# Patient Record
Sex: Male | Born: 1935 | Race: White | Hispanic: No | Marital: Married | Smoking: Former smoker
Health system: Southern US, Community
[De-identification: ages and names within clinical notes are randomized; demographics above are authoritative.]

## PROBLEM LIST (undated history)

## (undated) DIAGNOSIS — F0390 Unspecified dementia without behavioral disturbance: Secondary | ICD-10-CM

## (undated) DIAGNOSIS — F0280 Dementia in other diseases classified elsewhere without behavioral disturbance: Secondary | ICD-10-CM

## (undated) DIAGNOSIS — J189 Pneumonia, unspecified organism: Secondary | ICD-10-CM

## (undated) DIAGNOSIS — E079 Disorder of thyroid, unspecified: Secondary | ICD-10-CM

## (undated) DIAGNOSIS — E785 Hyperlipidemia, unspecified: Secondary | ICD-10-CM

## (undated) DIAGNOSIS — I10 Essential (primary) hypertension: Secondary | ICD-10-CM

## (undated) DIAGNOSIS — G309 Alzheimer's disease, unspecified: Secondary | ICD-10-CM

## (undated) HISTORY — PX: LUMBAR LAMINECTOMY: SHX95

## (undated) HISTORY — DX: Hyperlipidemia, unspecified: E78.5

## (undated) HISTORY — DX: Essential (primary) hypertension: I10

## (undated) HISTORY — DX: Unspecified dementia, unspecified severity, without behavioral disturbance, psychotic disturbance, mood disturbance, and anxiety: F03.90

## (undated) HISTORY — PX: HIP SURGERY: SHX245

---

## 1998-01-06 ENCOUNTER — Ambulatory Visit (HOSPITAL_COMMUNITY): Admission: RE | Admit: 1998-01-06 | Discharge: 1998-01-06 | Payer: Self-pay | Admitting: Orthopaedic Surgery

## 2001-03-29 ENCOUNTER — Ambulatory Visit (HOSPITAL_COMMUNITY): Admission: RE | Admit: 2001-03-29 | Discharge: 2001-03-29 | Payer: Self-pay | Admitting: Endocrinology

## 2001-03-29 ENCOUNTER — Encounter: Payer: Self-pay | Admitting: Endocrinology

## 2001-04-15 ENCOUNTER — Encounter: Payer: Self-pay | Admitting: Neurosurgery

## 2001-04-16 ENCOUNTER — Encounter: Payer: Self-pay | Admitting: Neurosurgery

## 2001-04-16 ENCOUNTER — Inpatient Hospital Stay (HOSPITAL_COMMUNITY): Admission: RE | Admit: 2001-04-16 | Discharge: 2001-04-18 | Payer: Self-pay | Admitting: Neurosurgery

## 2001-12-17 ENCOUNTER — Inpatient Hospital Stay (HOSPITAL_COMMUNITY): Admission: EM | Admit: 2001-12-17 | Discharge: 2001-12-21 | Payer: Self-pay | Admitting: Emergency Medicine

## 2001-12-17 ENCOUNTER — Encounter: Payer: Self-pay | Admitting: Emergency Medicine

## 2001-12-18 ENCOUNTER — Encounter: Payer: Self-pay | Admitting: Endocrinology

## 2001-12-19 ENCOUNTER — Encounter: Payer: Self-pay | Admitting: Internal Medicine

## 2001-12-20 ENCOUNTER — Encounter: Payer: Self-pay | Admitting: Internal Medicine

## 2002-01-11 ENCOUNTER — Other Ambulatory Visit: Admission: RE | Admit: 2002-01-11 | Discharge: 2002-01-11 | Payer: Self-pay | Admitting: Internal Medicine

## 2002-02-02 ENCOUNTER — Encounter: Admission: RE | Admit: 2002-02-02 | Discharge: 2002-05-03 | Payer: Self-pay | Admitting: Endocrinology

## 2002-05-04 ENCOUNTER — Encounter: Admission: RE | Admit: 2002-05-04 | Discharge: 2002-05-06 | Payer: Self-pay | Admitting: Endocrinology

## 2002-07-22 ENCOUNTER — Encounter: Payer: Self-pay | Admitting: Orthopedic Surgery

## 2002-07-29 ENCOUNTER — Encounter: Payer: Self-pay | Admitting: Orthopedic Surgery

## 2002-07-29 ENCOUNTER — Inpatient Hospital Stay (HOSPITAL_COMMUNITY): Admission: RE | Admit: 2002-07-29 | Discharge: 2002-08-02 | Payer: Self-pay | Admitting: Orthopedic Surgery

## 2002-08-02 ENCOUNTER — Inpatient Hospital Stay (HOSPITAL_COMMUNITY)
Admission: RE | Admit: 2002-08-02 | Discharge: 2002-08-09 | Payer: Self-pay | Admitting: Physical Medicine & Rehabilitation

## 2005-01-08 ENCOUNTER — Emergency Department (HOSPITAL_COMMUNITY): Admission: EM | Admit: 2005-01-08 | Discharge: 2005-01-08 | Payer: Self-pay | Admitting: Emergency Medicine

## 2005-01-15 ENCOUNTER — Ambulatory Visit (HOSPITAL_COMMUNITY): Admission: RE | Admit: 2005-01-15 | Discharge: 2005-01-15 | Payer: Self-pay | Admitting: Endocrinology

## 2012-05-07 ENCOUNTER — Encounter (HOSPITAL_COMMUNITY): Payer: Self-pay | Admitting: *Deleted

## 2012-05-07 ENCOUNTER — Emergency Department (HOSPITAL_COMMUNITY): Payer: Medicare Other

## 2012-05-07 ENCOUNTER — Inpatient Hospital Stay (HOSPITAL_COMMUNITY)
Admission: EM | Admit: 2012-05-07 | Discharge: 2012-05-11 | DRG: 872 | Disposition: A | Discharge status: Skilled Nursing Facility | Payer: Medicare Other | Attending: Endocrinology | Admitting: Endocrinology

## 2012-05-07 DIAGNOSIS — Z87891 Personal history of nicotine dependence: Secondary | ICD-10-CM

## 2012-05-07 DIAGNOSIS — K089 Disorder of teeth and supporting structures, unspecified: Secondary | ICD-10-CM | POA: Diagnosis present

## 2012-05-07 DIAGNOSIS — Z882 Allergy status to sulfonamides status: Secondary | ICD-10-CM

## 2012-05-07 DIAGNOSIS — I10 Essential (primary) hypertension: Secondary | ICD-10-CM | POA: Diagnosis present

## 2012-05-07 DIAGNOSIS — D649 Anemia, unspecified: Secondary | ICD-10-CM | POA: Diagnosis present

## 2012-05-07 DIAGNOSIS — F329 Major depressive disorder, single episode, unspecified: Secondary | ICD-10-CM | POA: Diagnosis present

## 2012-05-07 DIAGNOSIS — M216X9 Other acquired deformities of unspecified foot: Secondary | ICD-10-CM | POA: Diagnosis present

## 2012-05-07 DIAGNOSIS — E785 Hyperlipidemia, unspecified: Secondary | ICD-10-CM | POA: Diagnosis present

## 2012-05-07 DIAGNOSIS — A419 Sepsis, unspecified organism: Secondary | ICD-10-CM | POA: Diagnosis present

## 2012-05-07 DIAGNOSIS — E860 Dehydration: Secondary | ICD-10-CM | POA: Diagnosis present

## 2012-05-07 DIAGNOSIS — N390 Urinary tract infection, site not specified: Secondary | ICD-10-CM | POA: Diagnosis present

## 2012-05-07 DIAGNOSIS — R627 Adult failure to thrive: Secondary | ICD-10-CM | POA: Diagnosis present

## 2012-05-07 DIAGNOSIS — J449 Chronic obstructive pulmonary disease, unspecified: Secondary | ICD-10-CM | POA: Diagnosis present

## 2012-05-07 DIAGNOSIS — F0280 Dementia in other diseases classified elsewhere without behavioral disturbance: Secondary | ICD-10-CM | POA: Diagnosis present

## 2012-05-07 DIAGNOSIS — Z96649 Presence of unspecified artificial hip joint: Secondary | ICD-10-CM

## 2012-05-07 DIAGNOSIS — E871 Hypo-osmolality and hyponatremia: Secondary | ICD-10-CM | POA: Diagnosis present

## 2012-05-07 DIAGNOSIS — F3289 Other specified depressive episodes: Secondary | ICD-10-CM | POA: Diagnosis present

## 2012-05-07 DIAGNOSIS — D72829 Elevated white blood cell count, unspecified: Secondary | ICD-10-CM | POA: Diagnosis present

## 2012-05-07 DIAGNOSIS — G309 Alzheimer's disease, unspecified: Secondary | ICD-10-CM | POA: Diagnosis present

## 2012-05-07 DIAGNOSIS — A4151 Sepsis due to Escherichia coli [E. coli]: Principal | ICD-10-CM | POA: Diagnosis present

## 2012-05-07 DIAGNOSIS — Z792 Long term (current) use of antibiotics: Secondary | ICD-10-CM

## 2012-05-07 DIAGNOSIS — J189 Pneumonia, unspecified organism: Secondary | ICD-10-CM | POA: Diagnosis present

## 2012-05-07 DIAGNOSIS — M1990 Unspecified osteoarthritis, unspecified site: Secondary | ICD-10-CM | POA: Diagnosis present

## 2012-05-07 DIAGNOSIS — K219 Gastro-esophageal reflux disease without esophagitis: Secondary | ICD-10-CM | POA: Diagnosis present

## 2012-05-07 DIAGNOSIS — E039 Hypothyroidism, unspecified: Secondary | ICD-10-CM | POA: Diagnosis present

## 2012-05-07 DIAGNOSIS — F32A Depression, unspecified: Secondary | ICD-10-CM | POA: Diagnosis present

## 2012-05-07 DIAGNOSIS — M48061 Spinal stenosis, lumbar region without neurogenic claudication: Secondary | ICD-10-CM | POA: Diagnosis present

## 2012-05-07 DIAGNOSIS — F0390 Unspecified dementia without behavioral disturbance: Secondary | ICD-10-CM | POA: Diagnosis present

## 2012-05-07 DIAGNOSIS — R197 Diarrhea, unspecified: Secondary | ICD-10-CM | POA: Diagnosis present

## 2012-05-07 DIAGNOSIS — Z66 Do not resuscitate: Secondary | ICD-10-CM | POA: Diagnosis present

## 2012-05-07 DIAGNOSIS — R269 Unspecified abnormalities of gait and mobility: Secondary | ICD-10-CM | POA: Diagnosis present

## 2012-05-07 DIAGNOSIS — J4489 Other specified chronic obstructive pulmonary disease: Secondary | ICD-10-CM | POA: Diagnosis present

## 2012-05-07 HISTORY — DX: Disorder of thyroid, unspecified: E07.9

## 2012-05-07 HISTORY — DX: Dementia in other diseases classified elsewhere, unspecified severity, without behavioral disturbance, psychotic disturbance, mood disturbance, and anxiety: F02.80

## 2012-05-07 HISTORY — DX: Alzheimer's disease, unspecified: G30.9

## 2012-05-07 HISTORY — DX: Pneumonia, unspecified organism: J18.9

## 2012-05-07 LAB — COMPREHENSIVE METABOLIC PANEL
ALT: 17 U/L (ref 0–53)
Alkaline Phosphatase: 85 U/L (ref 39–117)
BUN: 15 mg/dL (ref 6–23)
CO2: 24 mEq/L (ref 19–32)
Calcium: 9.3 mg/dL (ref 8.4–10.5)
GFR calc Af Amer: >90 mL/min (ref >90)
GFR calc non Af Amer: 86 mL/min — ABNORMAL LOW (ref >90)
Glucose, Bld: 112 mg/dL — ABNORMAL HIGH (ref 70–99)
Total Protein: 6.5 g/dL (ref 6.0–8.3)

## 2012-05-07 LAB — URINALYSIS, ROUTINE W REFLEX MICROSCOPIC
Ketones, ur: NEGATIVE mg/dL
Nitrite: POSITIVE — AB
Specific Gravity, Urine: 1.023 (ref 1.005–1.030)
Urobilinogen, UA: 4 mg/dL — ABNORMAL HIGH (ref 0.0–1.0)
pH: 6 (ref 5.0–8.0)

## 2012-05-07 LAB — DIFFERENTIAL
Basophils Relative: 0 % (ref 0–1)
Lymphocytes Relative: 5 % — ABNORMAL LOW (ref 12–46)
Lymphs Abs: 1 10*3/uL (ref 0.7–4.0)
Monocytes Absolute: 2.2 10*3/uL — ABNORMAL HIGH (ref 0.1–1.0)
Monocytes Relative: 11 % (ref 3–12)
Neutro Abs: 17.6 10*3/uL — ABNORMAL HIGH (ref 1.7–7.7)
Neutrophils Relative %: 84 % — ABNORMAL HIGH (ref 43–77)

## 2012-05-07 LAB — CBC
HCT: 33.7 % — ABNORMAL LOW (ref 39.0–52.0)
Hemoglobin: 12 g/dL — ABNORMAL LOW (ref 13.0–17.0)
MCHC: 35.6 g/dL (ref 30.0–36.0)
RBC: 3.76 MIL/uL — ABNORMAL LOW (ref 4.22–5.81)
WBC: 20.9 10*3/uL — ABNORMAL HIGH (ref 4.0–10.5)

## 2012-05-07 LAB — URINE MICROSCOPIC-ADD ON

## 2012-05-07 MED ORDER — ACETAMINOPHEN 325 MG PO TABS
650.0000 mg | ORAL_TABLET | Freq: Once | ORAL | Status: AC
  Administered 2012-05-07: 650 mg via ORAL
  Filled 2012-05-07: qty 2

## 2012-05-07 MED ORDER — DEXTROSE 5 % IV SOLN
1.0000 g | Freq: Once | INTRAVENOUS | Status: AC
  Administered 2012-05-07: 1 g via INTRAVENOUS
  Filled 2012-05-07: qty 10

## 2012-05-07 MED ORDER — SODIUM CHLORIDE 0.9 % IV BOLUS (SEPSIS)
500.0000 mL | Freq: Once | INTRAVENOUS | Status: AC
  Administered 2012-05-07: 21:00:00 via INTRAVENOUS

## 2012-05-07 NOTE — ED Notes (Signed)
Attempted to assist patient to standing position to void. Pt unsteady on feet and unable to. Pt assisted back to bed. Verbal order given by Dr. Judd Lien for foley cath insertion. Will continue to monitor.

## 2012-05-07 NOTE — ED Notes (Signed)
Per EMS- pt has had increased weakness, diarhea and fever for approx 2 days. Pt needs assistance with ADLs now. Pt is unsure if he has had a bowel movement today. Vitals stable with EMS. No neuro deficits with EMS. Pt family states that he has had a decresed appetite and states that his affect is different as well. States that pt has a "blank stare" at times.

## 2012-05-07 NOTE — ED Provider Notes (Signed)
History     CSN: [id number]  Arrival date & time 05/07/12  1740   First MD Initiated Contact with Patient 05/07/12 1830      Chief Complaint  Patient presents with  . Diarrhea  . Weakness    (Consider location/radiation/quality/duration/timing/severity/associated sxs/prior treatment) HPI Comments: Patient for eval of weakness, diarrhea at home the past few days.  He was recently given amoxicillin for a dental infection.  The diarrhea is non-bloody.  The daughter states that the patient is having a hard time ambulating and is extremely weak.  He was found to have a fever of 103 upon arrival here.  Patient is a 78 y.o. male presenting with diarrhea and weakness. The history is provided by the patient and a relative (daughter).  Diarrhea The primary symptoms include fever, fatigue and diarrhea. Primary symptoms comment: weakness Episode onset: 3 days ago. The onset was sudden. The problem has been gradually worsening.  The illness is also significant for chills.  Weakness The primary symptoms include fever. Primary symptoms comment: weakness  Additional symptoms include weakness.    Past Medical History  Diagnosis Date  . Alzheimer disease   . Pneumonia   . Thyroid disease     Past Surgical History  Procedure Date  . Hip surgery   . Lumbar laminectomy     No family history on file.  History  Substance Use Topics  . Smoking status: Former Smoker    Types: Cigarettes  . Smokeless tobacco: Not on file  . Alcohol Use: 0.0 oz/week     4 oz a night      Review of Systems  Constitutional: Positive for fever, chills and fatigue. Negative for diaphoresis.  Gastrointestinal: Positive for diarrhea.  Neurological: Positive for weakness.  All other systems reviewed and are negative.    Allergies  Sulfur  Home Medications   Current Outpatient Rx  Name Route Sig Dispense Refill  . DONEPEZIL HCL 10 MG PO TABS Oral Take 10 mg by mouth at bedtime as needed.    Marland Kitchen  DOXAZOSIN MESYLATE 2 MG PO TABS Oral Take 2 mg by mouth at bedtime.    Marland Kitchen LEVOTHYROXINE SODIUM 175 MCG PO TABS Oral Take by mouth daily.    Marland Kitchen LISINOPRIL 10 MG PO TABS Oral Take 10 mg by mouth daily.    Marland Kitchen MEMANTINE HCL 10 MG PO TABS Oral Take 20 mg by mouth daily.    Marland Kitchen SIMVASTATIN 40 MG PO TABS Oral Take 40 mg by mouth every evening.    . VENLAFAXINE HCL ER 75 MG PO CP24 Oral Take 75 mg by mouth daily.    Marland Kitchen VITAMIN D (ERGOCALCIFEROL) 50000 UNITS PO CAPS Oral Take 50,000 Units by mouth every 7 (seven) days.      BP 101/50  Temp 103 F (39.4 C) (Oral)  Resp 24  SpO2 97%  Physical Exam  Nursing note and vitals reviewed. Constitutional: He is oriented to person, place, and time. He appears well-developed and well-nourished.       Elderly male, no acute distress.  HENT:  Head: Normocephalic and atraumatic.  Mouth/Throat: Oropharynx is clear and moist.  Neck: Normal range of motion. Neck supple.  Cardiovascular: Normal rate and regular rhythm.   No murmur heard. Pulmonary/Chest: Effort normal and breath sounds normal. No respiratory distress. He has no wheezes.  Abdominal: Soft. Bowel sounds are normal. He exhibits no distension. There is no tenderness.  Musculoskeletal: Normal range of motion. He exhibits no edema.  Neurological: He  is alert and oriented to person, place, and time. No cranial nerve deficit. Coordination normal.  Skin: Skin is warm and dry.    ED Course  Procedures (including critical care time)   Labs Reviewed  CBC WITH DIFFERENTIAL  COMPREHENSIVE METABOLIC PANEL  URINALYSIS, ROUTINE W REFLEX MICROSCOPIC  CULTURE, BLOOD (ROUTINE X 2)  CULTURE, BLOOD (ROUTINE X 2)  URINE CULTURE  CLOSTRIDIUM DIFFICILE BY PCR   No results found.   No diagnosis found.   Date: 05/08/2012  Rate: 114  Rhythm: sinus tachycardia  QRS Axis: normal  Intervals: normal  ST/T Wave abnormalities: nonspecific T wave changes  Conduction Disutrbances:none  Narrative Interpretation:     Old EKG Reviewed: none available    MDM  The patient presents complaining of weakness and fever for the past two days.  Workup today reveals an elevated wbc with bandemia and ua consistent with a uti.  He was given ceftriaxone and I have consulted with Dr. Waynard Edwards from [facility] for admission.          Geoffery Lyons, MD 05/08/12 [phone]

## 2012-05-07 NOTE — H&P (Signed)
Corey Farmer is an 78 y.o. male.   Chief Complaint: weakness HPI: Corey Farmer is a pleasant 78 yo gentleman who was in his usual state of health until two days ago when he started feeling weak and sick.  He says there may have been some urine burning noted.  He normally walks with a walker but couldn't do that the last few days and was too weak to dress himself or go to the bathroom by himself which he usually does on his own.  He had some confusion as well. He was just not himself.  His forehead did feel warm.   When he woke yesterday morning he had a sweat.  Today so weak that EMS called. In the ER found to have severe uti vs. Urosepsis.  He was amoxicillin last week for a bad tooth. He had bad diarrhea yesterday.  Past Medical History  Diagnosis Date  . Alzheimer disease   . Pneumonia   . Thyroid disease Thoracentesis/pleural effusion Anemia OA Depression Gait disorder/foot drop Hyperlipidemia GERD ABNL lft Inc. Ck enzyme in past Spinal stenosis low back s/p surgery     Past Surgical History  Procedure Date  . Hip surgery  2003 L THR   . Lumbar laminectomy Bilateral hernia repairs Cataract surgery       family history :  Mother died 38.5 yrs old, dad died 100 rheumatic fever Social History: Widowed 2009 after 46 yrs of marriage. She had dementia.   reports that he has quit smoking. His smoking use included Cigarettes. He does not have any smokeless tobacco history on file. He reports that he drinks alcohol. He reports that he does not use illicit drugs.   Daughter Corey Farmer is with him.  He was a Programme researcher, broadcasting/film/video.  Allergies:  Allergies  Allergen Reactions  . Sulfa novocaine      Home meds:   doxasosin 4 mg 1/2 tab daily namenda 10 mg po bid Levothyroxine 175 mcg one po once a day Simvastatin 40 mg each evening Donepezil 10 mg po qhs Lisinopril 20 mg 1/2 pill daily Vitamin d 56213 iu once a week Venlafaxine ER  75 mg  Results for orders placed during the hospital  encounter of 05/07/12 (from the past 48 hour(s))  COMPREHENSIVE METABOLIC PANEL     Status: Abnormal   Collection Time   05/07/12  7:20 PM      Component Value Range Comment   Sodium 125 (*) 135 - 145 mEq/L    Potassium 4.2  3.5 - 5.1 mEq/L    Chloride 92 (*) 96 - 112 mEq/L    CO2 24  19 - 32 mEq/L    Glucose, Bld 112 (*) 70 - 99 mg/dL    BUN 15  6 - 23 mg/dL    Creatinine, Ser 0.86  0.50 - 1.35 mg/dL    Calcium 9.3  8.4 - 57.8 mg/dL    Total Protein 6.5  6.0 - 8.3 g/dL    Albumin 3.1 (*) 3.5 - 5.2 g/dL    AST 24  0 - 37 U/L    ALT 17  0 - 53 U/L    Alkaline Phosphatase 85  39 - 117 U/L    Total Bilirubin 0.4  0.3 - 1.2 mg/dL    GFR calc non Af Amer 86 (*) >90 mL/min    GFR calc Af Amer >90  >90 mL/min   URINALYSIS, ROUTINE W REFLEX MICROSCOPIC     Status: Abnormal   Collection Time  05/07/12  8:10 PM      Component Value Range Comment   Color, Urine AMBER (*) YELLOW BIOCHEMICALS MAY BE AFFECTED BY COLOR   APPearance CLOUDY (*) CLEAR    Specific Gravity, Urine 1.023  1.005 - 1.030    pH 6.0  5.0 - 8.0    Glucose, UA NEGATIVE  NEGATIVE mg/dL    Hgb urine dipstick LARGE (*) NEGATIVE    Bilirubin Urine SMALL (*) NEGATIVE    Ketones, ur NEGATIVE  NEGATIVE mg/dL    Protein, ur 956 (*) NEGATIVE mg/dL    Urobilinogen, UA 4.0 (*) 0.0 - 1.0 mg/dL    Nitrite POSITIVE (*) NEGATIVE    Leukocytes, UA MODERATE (*) NEGATIVE   URINE MICROSCOPIC-ADD ON     Status: Abnormal   Collection Time   05/07/12  8:10 PM      Component Value Range Comment   WBC, UA TOO NUMEROUS TO COUNT  <3 WBC/hpf    RBC / HPF 0-2  <3 RBC/hpf    Bacteria, UA MANY (*) RARE   CBC     Status: Abnormal   Collection Time   05/07/12  9:03 PM      Component Value Range Comment   WBC 20.9 (*) 4.0 - 10.5 K/uL    RBC 3.76 (*) 4.22 - 5.81 MIL/uL    Hemoglobin 12.0 (*) 13.0 - 17.0 g/dL    HCT 21.3 (*) 08.6 - 52.0 %    MCV 89.6  78.0 - 100.0 fL    MCH 31.9  26.0 - 34.0 pg    MCHC 35.6  30.0 - 36.0 g/dL    RDW 57.8  46.9  - 62.9 %    Platelets 127 (*) 150 - 400 K/uL   DIFFERENTIAL     Status: Abnormal   Collection Time   05/07/12  9:03 PM      Component Value Range Comment   Neutrophils Relative 84 (*) 43 - 77 %    Neutro Abs 17.6 (*) 1.7 - 7.7 K/uL    Lymphocytes Relative 5 (*) 12 - 46 %    Lymphs Abs 1.0  0.7 - 4.0 K/uL    Monocytes Relative 11  3 - 12 %    Monocytes Absolute 2.2 (*) 0.1 - 1.0 K/uL    Eosinophils Relative 0  0 - 5 %    Eosinophils Absolute 0.0  0.0 - 0.7 K/uL    Basophils Relative 0  0 - 1 %    Basophils Absolute 0.0  0.0 - 0.1 K/uL    Dg Chest Port 1 View  05/07/2012  *RADIOLOGY REPORT*  Clinical Data: Fever, hypertension.  PORTABLE CHEST - 1 VIEW  Comparison: None.  The patient's prior films from 2003 are not available for comparison.  Findings: There is calcified pleural plaque identified.  The lungs are hyperinflated.  There is patchy opacity in bilateral lung bases which are least in part due to atelectasis but superimposed pneumonia particularly in the right lung base is not excluded. There is mild left cyst versus scar in the lateral right mid lung. The aorta is tortuous.  The heart size is normal.  There is scoliosis of spine.  IMPRESSION: Changes of COPD. There is patchy opacity in bilateral lung bases which are least in part due to atelectasis but superimposed pneumonia particularly in the right lung base is not excluded   Original Report Authenticated By: Sherian Rein, M.D.     ROS:as per hpi  Blood pressure 104/54, pulse  77, temperature 103 F (39.4 C), temperature source Oral, resp. rate 21, SpO2 98.00%.  Age appropriate male, pleasant.  he repeats questions a lot.   no pallor or icterus.  no jvd.   lungs are cta bilat. no w/r/r.  heart is rrr no m/r/g. abd soft, nt, nd. no mass or hsm,    No edema. Moe times 4.  Assessment/Plan UTI with possible urosepsis however given recent amoxicillin use and significant diarrhea I am also concerned about the possibility of c. Diff  colitis.  I will cover with rocephin, cipro and flagyl until we see some culture data.  Admit to tele.  Hydrate with IVF.  Hold ace inhibitor and doxazosin.   He is seriously ill.  He does have significant dementia.  I have discussed code status.  He desires No Code Blue and this will be honored.  Ezequiel Kayser, MD 05/07/2012, 9:49 PM

## 2012-05-07 NOTE — ED Notes (Signed)
Admitting MD at bedside.

## 2012-05-08 DIAGNOSIS — K219 Gastro-esophageal reflux disease without esophagitis: Secondary | ICD-10-CM | POA: Diagnosis present

## 2012-05-08 DIAGNOSIS — D649 Anemia, unspecified: Secondary | ICD-10-CM | POA: Diagnosis present

## 2012-05-08 DIAGNOSIS — R197 Diarrhea, unspecified: Secondary | ICD-10-CM | POA: Diagnosis present

## 2012-05-08 DIAGNOSIS — E039 Hypothyroidism, unspecified: Secondary | ICD-10-CM | POA: Diagnosis present

## 2012-05-08 DIAGNOSIS — E860 Dehydration: Secondary | ICD-10-CM | POA: Diagnosis present

## 2012-05-08 DIAGNOSIS — R269 Unspecified abnormalities of gait and mobility: Secondary | ICD-10-CM | POA: Diagnosis present

## 2012-05-08 DIAGNOSIS — A419 Sepsis, unspecified organism: Secondary | ICD-10-CM | POA: Diagnosis present

## 2012-05-08 DIAGNOSIS — J189 Pneumonia, unspecified organism: Secondary | ICD-10-CM | POA: Diagnosis present

## 2012-05-08 DIAGNOSIS — J449 Chronic obstructive pulmonary disease, unspecified: Secondary | ICD-10-CM | POA: Diagnosis present

## 2012-05-08 DIAGNOSIS — F329 Major depressive disorder, single episode, unspecified: Secondary | ICD-10-CM | POA: Diagnosis present

## 2012-05-08 DIAGNOSIS — F0390 Unspecified dementia without behavioral disturbance: Secondary | ICD-10-CM | POA: Diagnosis present

## 2012-05-08 DIAGNOSIS — I10 Essential (primary) hypertension: Secondary | ICD-10-CM | POA: Diagnosis present

## 2012-05-08 DIAGNOSIS — D72829 Elevated white blood cell count, unspecified: Secondary | ICD-10-CM | POA: Diagnosis present

## 2012-05-08 DIAGNOSIS — E871 Hypo-osmolality and hyponatremia: Secondary | ICD-10-CM | POA: Diagnosis present

## 2012-05-08 LAB — COMPREHENSIVE METABOLIC PANEL
ALT: 16 U/L (ref 0–53)
Alkaline Phosphatase: 94 U/L (ref 39–117)
BUN: 10 mg/dL (ref 6–23)
CO2: 25 mEq/L (ref 19–32)
GFR calc Af Amer: >90 mL/min (ref >90)
GFR calc non Af Amer: 89 mL/min — ABNORMAL LOW (ref >90)
Glucose, Bld: 104 mg/dL — ABNORMAL HIGH (ref 70–99)
Potassium: 3.6 mEq/L (ref 3.5–5.1)
Sodium: 128 mEq/L — ABNORMAL LOW (ref 135–145)
Total Bilirubin: 0.3 mg/dL (ref 0.3–1.2)
Total Protein: 5.7 g/dL — ABNORMAL LOW (ref 6.0–8.3)

## 2012-05-08 LAB — DIFFERENTIAL
Basophils Absolute: 0 10*3/uL (ref 0.0–0.1)
Basophils Relative: 0 % (ref 0–1)
Lymphocytes Relative: 8 % — ABNORMAL LOW (ref 12–46)
Monocytes Absolute: 2.1 10*3/uL — ABNORMAL HIGH (ref 0.1–1.0)
Neutro Abs: 10.1 10*3/uL — ABNORMAL HIGH (ref 1.7–7.7)
Neutrophils Relative %: 76 % (ref 43–77)

## 2012-05-08 LAB — TSH: TSH: 0.51 u[IU]/mL (ref 0.350–4.500)

## 2012-05-08 LAB — CBC
HCT: 34.8 % — ABNORMAL LOW (ref 39.0–52.0)
MCHC: 35.9 g/dL (ref 30.0–36.0)
Platelets: 101 10*3/uL — ABNORMAL LOW (ref 150–400)
RDW: 12.5 % (ref 11.5–15.5)
WBC: 13.4 10*3/uL — ABNORMAL HIGH (ref 4.0–10.5)

## 2012-05-08 LAB — CLOSTRIDIUM DIFFICILE BY PCR: Toxigenic C. Difficile by PCR: NEGATIVE

## 2012-05-08 MED ORDER — SODIUM CHLORIDE 0.9 % IV SOLN
INTRAVENOUS | Status: DC
  Administered 2012-05-08 – 2012-05-09 (×2): via INTRAVENOUS

## 2012-05-08 MED ORDER — ONDANSETRON HCL 4 MG/2ML IJ SOLN: 4.0000 mg | Freq: Four times a day (QID) | INTRAMUSCULAR | Status: DC | PRN

## 2012-05-08 MED ORDER — VITAMIN D (ERGOCALCIFEROL) 1.25 MG (50000 UNIT) PO CAPS: 50000.0000 [IU] | ORAL_CAPSULE | ORAL | Status: DC

## 2012-05-08 MED ORDER — DONEPEZIL HCL 10 MG PO TABS
10.0000 mg | ORAL_TABLET | Freq: Every day | ORAL | Status: DC
  Administered 2012-05-08 – 2012-05-10 (×4): 10 mg via ORAL
  Filled 2012-05-08 (×5): qty 1

## 2012-05-08 MED ORDER — ACETAMINOPHEN 325 MG PO TABS: 650.0000 mg | ORAL_TABLET | Freq: Four times a day (QID) | ORAL | Status: DC | PRN

## 2012-05-08 MED ORDER — ONDANSETRON HCL 4 MG PO TABS: 4.0000 mg | ORAL_TABLET | Freq: Four times a day (QID) | ORAL | Status: DC | PRN

## 2012-05-08 MED ORDER — LEVOTHYROXINE SODIUM 175 MCG PO TABS
175.0000 ug | ORAL_TABLET | Freq: Every day | ORAL | Status: DC
  Administered 2012-05-08 – 2012-05-11 (×4): 175 ug via ORAL
  Filled 2012-05-08 (×7): qty 1

## 2012-05-08 MED ORDER — METRONIDAZOLE 500 MG PO TABS
500.0000 mg | ORAL_TABLET | Freq: Three times a day (TID) | ORAL | Status: DC
  Administered 2012-05-08 – 2012-05-09 (×4): 500 mg via ORAL
  Filled 2012-05-08 (×7): qty 1

## 2012-05-08 MED ORDER — ACETAMINOPHEN 650 MG RE SUPP: 650.0000 mg | Freq: Four times a day (QID) | RECTAL | Status: DC | PRN

## 2012-05-08 MED ORDER — ENOXAPARIN SODIUM 40 MG/0.4ML ~~LOC~~ SOLN
40.0000 mg | Freq: Every day | SUBCUTANEOUS | Status: DC
  Administered 2012-05-08 – 2012-05-11 (×4): 40 mg via SUBCUTANEOUS
  Filled 2012-05-08 (×4): qty 0.4

## 2012-05-08 MED ORDER — MEMANTINE HCL 10 MG PO TABS
10.0000 mg | ORAL_TABLET | Freq: Two times a day (BID) | ORAL | Status: DC
  Administered 2012-05-08 – 2012-05-11 (×8): 10 mg via ORAL
  Filled 2012-05-08 (×9): qty 1

## 2012-05-08 MED ORDER — CIPROFLOXACIN IN D5W 400 MG/200ML IV SOLN
400.0000 mg | Freq: Two times a day (BID) | INTRAVENOUS | Status: DC
  Administered 2012-05-08 (×3): 400 mg via INTRAVENOUS
  Filled 2012-05-08 (×5): qty 200

## 2012-05-08 MED ORDER — VENLAFAXINE HCL ER 75 MG PO CP24
75.0000 mg | ORAL_CAPSULE | Freq: Every day | ORAL | Status: DC
  Administered 2012-05-08 – 2012-05-11 (×4): 75 mg via ORAL
  Filled 2012-05-08 (×4): qty 1

## 2012-05-08 MED ORDER — DEXTROSE 5 % IV SOLN
1.0000 g | Freq: Every day | INTRAVENOUS | Status: DC
  Administered 2012-05-08 – 2012-05-09 (×2): 1 g via INTRAVENOUS
  Filled 2012-05-08 (×3): qty 10

## 2012-05-08 MED ORDER — RISAQUAD PO CAPS
1.0000 | ORAL_CAPSULE | Freq: Every day | ORAL | Status: DC
  Administered 2012-05-08 – 2012-05-11 (×4): 1 via ORAL
  Filled 2012-05-08 (×4): qty 1

## 2012-05-08 NOTE — Progress Notes (Signed)
Subjective: Feels better. Got some sleep. Ate well at breakfast. No chills or sweats. No cough. No SOB. Some generalized aches. No more BM"s at all  Objective: Vital signs in last 24 hours: Temp:  [98.6 F (37 C)-103 F (39.4 C)] 99 F (37.2 C) (08/23 0523) Pulse Rate:  [73-83] 83  (08/23 0523) Resp:  [17-27] 18  (08/23 0523) BP: (101-136)/(50-83) 113/66 mmHg (08/23 0523) SpO2:  [85 %-98 %] 93 % (08/23 0523) Weight:  [79.6 kg (175 lb 7.8 oz)] 79.6 kg (175 lb 7.8 oz) (08/23 0049)  Intake/Output from previous day: 08/22 0701 - 08/23 0700 In: -  Out: 1250 [Urine:1250] Intake/Output this shift:    General: alert, nontoxic . Sl right ptosis and esotropia. Neck supple. Lungs distant, no wheeze. Ht regular with murmur. abd non distended, soft, NT. Extrems: no edema. Awake, alert, mentating at baseline  Lab Results   Basename 05/08/12 0630 05/07/12 2103  WBC 13.4* 20.9*  RBC 3.83* 3.76*  HGB 12.5* 12.0*  HCT 34.8* 33.7*  MCV 90.9 89.6  MCH 32.6 31.9  RDW 12.5 12.2  PLT 101* 127*    Basename 05/08/12 0630 05/07/12 1920  NA 128* 125*  K 3.6 4.2  CL 93* 92*  CO2 25 24  GLUCOSE 104* 112*  BUN 10 15  CREATININE 0.62 0.67  CALCIUM 8.8 9.3    Studies/Results: Dg Chest Port 1 View  05/07/2012  *RADIOLOGY REPORT*  Clinical Data: Fever, hypertension.  PORTABLE CHEST - 1 VIEW  Comparison: None.  The patient's prior films from 2003 are not available for comparison.  Findings: There is calcified pleural plaque identified.  The lungs are hyperinflated.  There is patchy opacity in bilateral lung bases which are least in part due to atelectasis but superimposed pneumonia particularly in the right lung base is not excluded. There is mild left cyst versus scar in the lateral right mid lung. The aorta is tortuous.  The heart size is normal.  There is scoliosis of spine.  IMPRESSION: Changes of COPD. There is patchy opacity in bilateral lung bases which are least in part due to atelectasis but  superimposed pneumonia particularly in the right lung base is not excluded   Original Report Authenticated By: Sherian Rein, M.D.     Scheduled Meds:   . acetaminophen  650 mg Oral Once  . acidophilus  1 capsule Oral Daily  . cefTRIAXone (ROCEPHIN)  IV  1 g Intravenous Once  . cefTRIAXone (ROCEPHIN)  IV  1 g Intravenous QHS  . ciprofloxacin  400 mg Intravenous BID  . donepezil  10 mg Oral QHS  . enoxaparin (LOVENOX) injection  40 mg Subcutaneous Daily  . levothyroxine  175 mcg Oral Q breakfast  . memantine  10 mg Oral BID  . metroNIDAZOLE  500 mg Oral Q8H  . sodium chloride  500 mL Intravenous Once  . venlafaxine XR  75 mg Oral Daily  . Vitamin D (Ergocalciferol)  50,000 Units Oral Q7 days   Continuous Infusions:   . sodium chloride 100 mL/hr at 05/08/12 0202   PRN Meds:acetaminophen, acetaminophen, ondansetron (ZOFRAN) IV, ondansetron  Assessment/Plan:   Patient Active Problem List  Diagnosis  . Sepsis due to urinary tract infection: doing well. Leukocytosis is better. BP is fine. Check LFT and CBC again in AM. On rocephin and cipro. Renal function stable  . Hyponatremia: sl better at 128, check again in AM  . Dementia: seems to be at baseline, on dual therapy  . Gait disorder: long  standing. At risk for falls  . Pneumonia: port XR suggested RLL infiltrate. Do 2 view XR tomorrow  . Leukocytosis: improving  . Dehydration: reduce fluids, advance diet  . Anemia: doing well  . Hypothyroid: check in AM  . Hypertension: meds on hold  . Depression: continue Rx  . GERD (gastroesophageal reflux disease): on Rx  . Diarrhea:  No more BM at all, doubt C diff. May be able to stop flagyl  . COPD (chronic obstructive pulmonary disease): sats OK     LOS: 1 day   Bray Vickerman ALAN 05/08/2012, 8:58 AM

## 2012-05-08 NOTE — Evaluation (Signed)
Occupational Therapy Evaluation Patient Details Name: Corey Farmer MRN: [id number] DOB: [birth year] Today's Date: 05/08/2012 Time: 4098-1191 OT Time Calculation (min): 37 min  OT Assessment / Plan / Recommendation Clinical Impression  This 78 yo male admitted with weakness and found to have severe uti vs. urosepsism presents to acute OT with problems below. Will benefit from acute OT with follow up at SNF.    OT Assessment  Patient needs continued OT Services    Follow Up Recommendations  Skilled nursing facility    Barriers to Discharge Decreased caregiver support    Equipment Recommendations  Defer to next venue    Recommendations for Other Services    Frequency  Min 2X/week    Precautions / Restrictions Precautions Precautions: Fall Restrictions Weight Bearing Restrictions: No       ADL  Eating/Feeding: Simulated;Independent Where Assessed - Eating/Feeding: Chair Grooming: Set up;Supervision/safety Where Assessed - Grooming: Supported sitting Upper Body Bathing: Simulated;Supervision/safety;Set up Where Assessed - Upper Body Bathing: Supported sitting Lower Body Bathing: Simulated;Moderate assistance Where Assessed - Lower Body Bathing: Supported sit to stand Upper Body Dressing: Simulated;Minimal assistance Where Assessed - Upper Body Dressing: Supported sitting Lower Body Dressing: Moderate assistance Where Assessed - Lower Body Dressing: Supported sit to Pharmacist, hospital: Performed;Minimal Dentist Method: Surveyor, minerals: Materials engineer and Hygiene: Performed;+1 Total assistance Where Assessed - Engineer, mining and Hygiene: Standing Equipment Used: Rolling walker Transfers/Ambulation Related to ADLs: Min  A transfers    OT Diagnosis: Cognitive deficits;Generalized weakness  OT Problem List: Decreased strength;Decreased activity tolerance;Impaired balance (sitting  and/or standing);Decreased cognition;Decreased safety awareness OT Treatment Interventions: Self-care/ADL training;DME and/or AE instruction;Patient/family education;Balance training;Therapeutic activities   OT Goals Acute Rehab OT Goals OT Goal Formulation: With patient Time For Goal Achievement: 05/22/12 Potential to Achieve Goals: Good ADL Goals Pt Will Perform Grooming: with set-up;with supervision;Standing at sink;Supported (2 tasks) ADL Goal: Grooming - Progress: Goal set today Pt Will Perform Upper Body Bathing: with set-up;with supervision;Unsupported;Sitting, edge of bed;Sitting, chair ADL Goal: Upper Body Bathing - Progress: Goal set today Pt Will Perform Lower Body Bathing: with min assist;Supported;Sit to stand from chair;Sit to stand from bed ADL Goal: Lower Body Bathing - Progress: Goal set today Pt Will Perform Upper Body Dressing: with min assist;Unsupported;Sitting, bed;Sitting, chair ADL Goal: Upper Body Dressing - Progress: Goal set today Pt Will Perform Lower Body Dressing: with min assist;Supported;Sit to stand from chair;Sit to stand from bed ADL Goal: Lower Body Dressing - Progress: Goal set today Pt Will Transfer to Toilet: Ambulation;with DME;Comfort height toilet;Grab bars (min guard A) ADL Goal: Toilet Transfer - Progress: Goal set today Pt Will Perform Toileting - Clothing Manipulation: with min assist;Standing ADL Goal: Toileting - Clothing Manipulation - Progress: Goal set today Pt Will Perform Toileting - Hygiene: with min assist;Sit to stand from 3-in-1/toilet ADL Goal: Toileting - Hygiene - Progress: Goal set today Miscellaneous OT Goals Miscellaneous OT Goal #1: Pt will be able to roll left and right with supervision to A with BADLs OT Goal: Miscellaneous Goal #1 - Progress: Goal set today Miscellaneous OT Goal #2: Pt will be Supervison in/OOB for BADLs OT Goal: Miscellaneous Goal #2 - Progress: Goal set today  Visit Information  Last OT Received On:  05/08/12 Assistance Needed: +2 (for ambulation)    Subjective Data  Subjective: I still drive, but not when I don't think I can/should   Prior Functioning  Vision/Perception  Home Living Lives With: Son;Family Available  Help at Discharge: Available 24 hours/day;Other (Comment) (unsure of relaibility of pt.'s response) Type of Home: Other (Comment) (to be determined) Additional Comments: Called and left message on daughter's cell phone to try and get information as to pt's PLOF with BADLs. Per pt he was doing everything for himself PTA except IADLs (family did meals and hired help did house chores) Prior Function Level of Independence: Independent with assistive device(s) (independent in walking if pt. response reliable) Driving: Yes Vocation: Retired Musician: [employer] Dominant Hand: Right      Cognition  Overall Cognitive Status: Impaired (unsure if baseline or worse) Area of Impairment: Safety/judgement Arousal/Alertness: Awake/alert Orientation Level: Time;Place;Situation Behavior During Session: [facility] for tasks performed Current Attention Level: Sustained;Other (comment) (needs redirection frequently) Memory: Decreased recall of precautions Memory Deficits: pt. unable to recall where PT student attends school, informed multiple times Following Commands: Follows one step commands consistently Safety/Judgement: Impulsive;Decreased safety judgement for tasks assessed Safety/Judgement - Other Comments: Getting up to #-n-1 next to chair unaware of lines and tubes that needed to be manuevered around. Awareness of Errors: Assistance required to identify errors made    Extremity/Trunk Assessment Right Upper Extremity Assessment RUE ROM/Strength/Tone: Within functional levels Left Upper Extremity Assessment LUE ROM/Strength/Tone: Within functional levels Right Lower Extremity Assessment RLE ROM/Strength/Tone: Unable to fully assess;Due to impaired cognition Left  Lower Extremity Assessment LLE ROM/Strength/Tone: Unable to fully assess;Due to impaired cognition Trunk Assessment Trunk Assessment: Normal   Mobility Bed Mobility Bed Mobility: Supine to Sit;Sit to Supine Supine to Sit: 4: Min assist;HOB elevated;With rails Sit to Supine: Not Tested (comment) Details for Bed Mobility Assistance: cues to initiate mobility and safety and technique cues Transfers Transfers: Sit to Stand;Stand to Sit Sit to Stand: 4: Min assist;With upper extremity assist;With armrests;From chair/3-in-1 Sit to Stand: Patient Percentage: 70% Stand to Sit: 4: Min assist;With upper extremity assist;With armrests;To chair/3-in-1 Stand to Sit: Patient Percentage: 70% Details for Transfer Assistance: verbal cues for hand placement for sit to stand and stand to sit   Exercise    Balance    End of Session OT - End of Session Equipment Utilized During Treatment:  (RW) Activity Tolerance: Patient tolerated treatment well Patient left: in chair;with call bell/phone within reach       Evette Georges [phone] 05/08/2012, 4:21 PM

## 2012-05-08 NOTE — Evaluation (Signed)
Physical Therapy Evaluation Patient Details Name: Corey Farmer MRN: [id number] DOB: [birth year] Today's Date: 05/08/2012 Time: 6213-0865 PT Time Calculation (min): 21 min  PT Assessment / Plan / Recommendation Clinical Impression  Pt. was admitted with urosepsis and history of Alzheimers, foot drop on left and gait disorder.  Pt. presents to PT with altered mental status, easily distractible and with memory impairments.  No family present to obtain functional history from.  Pt. indicated he lives with son and daughter in law and is ambulatory with RW at baseline, but could not provide specific detalis.  Believe he will benefit from acute PT to focus on his  mobility deficits and ease burden  of care.    PT Assessment  Patient needs continued PT services    Follow Up Recommendations  Skilled nursing facility;Other (comment);Supervision/Assistance - 24 hour (unless family can meet need for 24 hour assist)    Barriers to Discharge        Equipment Recommendations  None recommended by PT    Recommendations for Other Services     Frequency Min 3X/week    Precautions / Restrictions Precautions Precautions: Fall Restrictions Weight Bearing Restrictions: No   Pertinent Vitals/Pain No pain indicated, no distress      Mobility  Bed Mobility Bed Mobility: Supine to Sit;Sit to Supine Supine to Sit: 4: Min assist;HOB elevated;With rails Sit to Supine: Not Tested (comment) Details for Bed Mobility Assistance: cues to initiate mobility and safety and technique cues Transfers Transfers: Sit to Stand;Stand to Sit;Stand Pivot Transfers Sit to Stand: 1: +2 Total assist;With upper extremity assist;From bed Sit to Stand: Patient Percentage: 70% Stand to Sit: 1: +2 Total assist;To chair/3-in-1;With armrests Stand to Sit: Patient Percentage: 70% Stand Pivot Transfers: 1: +2 Total assist Stand Pivot Transfers: Patient Percentage: 70% Details for Transfer Assistance: many and frequent safety  and technique cues needed to keep pt. safe; assist for safety and stability Ambulation/Gait Ambulation/Gait Assistance: Not tested (comment) Assistive device: Rolling walker    Exercises     PT Diagnosis: Abnormality of gait;Generalized weakness;Difficulty walking  PT Problem List: Decreased strength;Decreased activity tolerance;Decreased balance;Decreased mobility;Decreased cognition;Decreased knowledge of use of DME;Decreased safety awareness;Decreased knowledge of precautions PT Treatment Interventions: DME instruction;Gait training;Functional mobility training;Therapeutic activities;Therapeutic exercise;Balance training;Patient/family education   PT Goals Acute Rehab PT Goals PT Goal Formulation: Patient unable to participate in goal setting Time For Goal Achievement: 05/22/12 Potential to Achieve Goals: Good Pt will go Supine/Side to Sit: with modified independence PT Goal: Supine/Side to Sit - Progress: Goal set today Pt will go Sit to Supine/Side: with modified independence PT Goal: Sit to Supine/Side - Progress: Goal set today Pt will go Sit to Stand: with supervision PT Goal: Sit to Stand - Progress: Goal set today Pt will go Stand to Sit: with supervision PT Goal: Stand to Sit - Progress: Goal set today Pt will Transfer Bed to Chair/Chair to Bed: with supervision PT Transfer Goal: Bed to Chair/Chair to Bed - Progress: Goal set today Pt will Ambulate: 51 - 150 feet;with supervision PT Goal: Ambulate - Progress: Goal set today  Visit Information  Last PT Received On: 05/08/12 Assistance Needed: +2    Subjective Data  Subjective: "I want some bourbon" Patient Stated Goal: pt. unable to state goals   Prior Functioning  Home Living Lives With: Son;Family Available Help at Discharge: Available 24 hours/day;Other (Comment) (unsure of relaibility of pt.'s response) Type of Home: Other (Comment) (to be determined) Prior Function Level of Independence: Independent with  assistive device(s) (independent in walking if pt. response reliable) Communication Communication: No difficulties    Cognition  Overall Cognitive Status: No family/caregiver present to determine baseline cognitive functioning Area of Impairment: Memory;Attention;Safety/judgement;Following commands;Awareness of errors;Awareness of deficits;Problem solving Arousal/Alertness: Awake/alert Orientation Level: Disoriented to;Time;Situation Behavior During Session: [facility] for tasks performed Current Attention Level: Sustained;Other (comment) (needs redirection frequently) Memory: Decreased recall of precautions Memory Deficits: pt. unable to recall where PT student attends school, informed multiple times Following Commands: Follows one step commands consistently Safety/Judgement: Decreased awareness of safety precautions Awareness of Errors: Assistance required to identify errors made    Extremity/Trunk Assessment Right Upper Extremity Assessment RUE ROM/Strength/Tone: Unable to fully assess;Due to impaired cognition Left Upper Extremity Assessment LUE ROM/Strength/Tone: Unable to fully assess;Due to impaired cognition Right Lower Extremity Assessment RLE ROM/Strength/Tone: Unable to fully assess;Due to impaired cognition Left Lower Extremity Assessment LLE ROM/Strength/Tone: Unable to fully assess;Due to impaired cognition Trunk Assessment Trunk Assessment: Normal   Balance    End of Session PT - End of Session Equipment Utilized During Treatment: Gait belt Activity Tolerance: Patient tolerated treatment well;Other (comment) (easliy distracted) Patient left: in chair;with call bell/phone within reach Nurse Communication: Mobility status  GP     Ferman Hamming 05/08/2012, 3:18 PM Weldon Picking PT Acute Rehab Services [phone] [place] [phone]

## 2012-05-08 NOTE — Progress Notes (Signed)
Utilization review completed.  

## 2012-05-09 ENCOUNTER — Inpatient Hospital Stay (HOSPITAL_COMMUNITY): Payer: Medicare Other

## 2012-05-09 LAB — DIFFERENTIAL
Eosinophils Absolute: 0.2 10*3/uL (ref 0.0–0.7)
Eosinophils Relative: 2 % (ref 0–5)
Lymphocytes Relative: 16 % (ref 12–46)
Lymphs Abs: 1.7 10*3/uL (ref 0.7–4.0)
Monocytes Absolute: 2.2 10*3/uL — ABNORMAL HIGH (ref 0.1–1.0)

## 2012-05-09 LAB — CBC
HCT: 35.5 % — ABNORMAL LOW (ref 39.0–52.0)
MCH: 32.6 pg (ref 26.0–34.0)
MCV: 92.4 fL (ref 78.0–100.0)
RBC: 3.84 MIL/uL — ABNORMAL LOW (ref 4.22–5.81)
RDW: 12.6 % (ref 11.5–15.5)
WBC: 10.4 10*3/uL (ref 4.0–10.5)

## 2012-05-09 LAB — URINE CULTURE

## 2012-05-09 LAB — COMPREHENSIVE METABOLIC PANEL
AST: 20 U/L (ref 0–37)
Albumin: 2.5 g/dL — ABNORMAL LOW (ref 3.5–5.2)
Alkaline Phosphatase: 66 U/L (ref 39–117)
BUN: 10 mg/dL (ref 6–23)
Chloride: 97 mEq/L (ref 96–112)
Potassium: 4.4 mEq/L (ref 3.5–5.1)
Sodium: 133 mEq/L — ABNORMAL LOW (ref 135–145)
Total Bilirubin: 0.1 mg/dL — ABNORMAL LOW (ref 0.3–1.2)
Total Protein: 5.6 g/dL — ABNORMAL LOW (ref 6.0–8.3)

## 2012-05-09 NOTE — Progress Notes (Signed)
Subjective: 27M admitted with weakness, Dysuria, Adult FTT, Fever/chills, confusion, and sick. Dxed in ED and admitted with a severe uti vs. Urosepsis. He was on amoxicillin the prior week for a bad tooth and had some diarrhea prior to admit.  He is pleasantly confused and no C/o this am.  No Pain.  Objective: Vital signs in last 24 hours: Temp:  [97.2 F (36.2 C)-98.8 F (37.1 C)] 97.9 F (36.6 C) (08/24 0910) Pulse Rate:  [66-78] 72  (08/24 0910) Resp:  [18] 18  (08/24 0910) BP: (90-126)/(54-74) 126/67 mmHg (08/24 0910) SpO2:  [94 %-98 %] 95 % (08/24 0910) Weight:  [82.2 kg (181 lb 3.5 oz)] 82.2 kg (181 lb 3.5 oz) (08/23 2112) Weight change: 2.6 kg (5 lb 11.7 oz) Last BM Date: 05/08/12  CBG (last 3)  No results found for this basename: GLUCAP:3 in the last 72 hours  Intake/Output from previous day:  Intake/Output Summary (Last 24 hours) at 05/09/12 0957 Last data filed at 05/09/12 0800  Gross per 24 hour  Intake 2108.75 ml  Output   4450 ml  Net -2341.25 ml   08/23 0701 - 08/24 0700 In: 1801.8 [I.V.:1551.8; IV Piggyback:250] Out: 4450 [Urine:4450]   Physical Exam General: alert, nontoxic, pleasantly confused. Right eye ptosis and esotropia.  Neck supple.  Lungs distant, no wheeze.  Ht regular with murmur.  abd non distended, soft, NT.  Extrems: no edema.     Lab Results:  Basename 05/09/12 0530 05/08/12 0630  NA 133* 128*  K 4.4 3.6  CL 97 93*  CO2 27 25  GLUCOSE 115* 104*  BUN 10 10  CREATININE 0.74 0.62  CALCIUM 8.8 8.8  MG -- --  PHOS -- --     Basename 05/09/12 0530 05/08/12 0630  AST 20 23  ALT 16 16  ALKPHOS 66 94  BILITOT 0.1* 0.3  PROT 5.6* 5.7*  ALBUMIN 2.5* 2.7*     Basename 05/09/12 0530 05/08/12 0630  WBC 10.4 13.4*  NEUTROABS 6.3 10.1*  HGB 12.5* 12.5*  HCT 35.5* 34.8*  MCV 92.4 90.9  PLT 127* 101*    No results found for this basename: INR, PROTIME    No results found for this basename:  CKTOTAL:3,CKMB:3,CKMBINDEX:3,TROPONINI:3 in the last 72 hours   Basename 05/08/12 0630  TSH 0.510  T4TOTAL --  T3FREE --  THYROIDAB --    No results found for this basename: VITAMINB12:2,FOLATE:2,FERRITIN:2,TIBC:2,IRON:2,RETICCTPCT:2 in the last 72 hours  Micro Results: Recent Results (from the past 240 hour(s))  CULTURE, BLOOD (ROUTINE X 2)     Status: Normal (Preliminary result)   Collection Time   05/07/12  7:00 PM      Component Value Range Status Comment   Specimen Description BLOOD RIGHT ARM   Final    Special Requests BOTTLES DRAWN AEROBIC AND ANAEROBIC   Final    Culture  Setup Time 05/08/2012 03:03   Final    Culture     Final    Value:        BLOOD CULTURE RECEIVED NO GROWTH TO DATE CULTURE WILL BE HELD FOR 5 DAYS BEFORE ISSUING A FINAL NEGATIVE REPORT   Report Status PENDING   Incomplete   CULTURE, BLOOD (ROUTINE X 2)     Status: Normal (Preliminary result)   Collection Time   05/07/12  7:15 PM      Component Value Range Status Comment   Specimen Description BLOOD LEFT ARM   Final    Special Requests BOTTLES DRAWN  AEROBIC AND ANAEROBIC   Final    Culture  Setup Time 05/08/2012 03:03   Final    Culture     Final    Value:        BLOOD CULTURE RECEIVED NO GROWTH TO DATE CULTURE WILL BE HELD FOR 5 DAYS BEFORE ISSUING A FINAL NEGATIVE REPORT   Report Status PENDING   Incomplete   CLOSTRIDIUM DIFFICILE BY PCR     Status: Normal   Collection Time   05/08/12 11:23 AM      Component Value Range Status Comment   C difficile by pcr NEGATIVE  NEGATIVE Final      Studies/Results: Dg Chest 2 View  05/09/2012  *RADIOLOGY REPORT*  Clinical Data: Follow up right lower lobe pneumonia. Cough.  CHEST - 2 VIEW  Comparison: 05/07/2012  Findings: Heart size appears mildly enlarged.  The lung volumes are low.  Bilateral basilar atelectasis and/or airspace opacities are noted.  Compared with previous exam and the overall aeration the lung bases is worsening.  IMPRESSION:  1.   Decreased lung volumes with worsening aeration to the lung bases.   Original Report Authenticated By: Rosealee Albee, M.D.    Dg Chest Port 1 View  05/07/2012  *RADIOLOGY REPORT*  Clinical Data: Fever, hypertension.  PORTABLE CHEST - 1 VIEW  Comparison: None.  The patient's prior films from 2003 are not available for comparison.  Findings: There is calcified pleural plaque identified.  The lungs are hyperinflated.  There is patchy opacity in bilateral lung bases which are least in part due to atelectasis but superimposed pneumonia particularly in the right lung base is not excluded. There is mild left cyst versus scar in the lateral right mid lung. The aorta is tortuous.  The heart size is normal.  There is scoliosis of spine.  IMPRESSION: Changes of COPD. There is patchy opacity in bilateral lung bases which are least in part due to atelectasis but superimposed pneumonia particularly in the right lung base is not excluded   Original Report Authenticated By: Sherian Rein, M.D.      Medications: Scheduled:   . acidophilus  1 capsule Oral Daily  . cefTRIAXone (ROCEPHIN)  IV  1 g Intravenous QHS  . ciprofloxacin  400 mg Intravenous BID  . donepezil  10 mg Oral QHS  . enoxaparin (LOVENOX) injection  40 mg Subcutaneous Daily  . levothyroxine  175 mcg Oral Q breakfast  . memantine  10 mg Oral BID  . metroNIDAZOLE  500 mg Oral Q8H  . venlafaxine XR  75 mg Oral Daily  . Vitamin D (Ergocalciferol)  50,000 Units Oral Q7 days   Continuous:   . sodium chloride 75 mL/hr at 05/08/12 1005     Assessment/Plan: Principal Problem:  *Sepsis due to urinary tract infection Active Problems:  Hyponatremia  Dementia  Gait disorder  Pneumonia  Leukocytosis  Dehydration  Anemia  Hypothyroid  Hypertension  Depression  GERD (gastroesophageal reflux disease)  Diarrhea  COPD (chronic obstructive pulmonary disease)  Sepsis due to urinary tract infection: doing well. Leukocytosis is better (20 - 13 -  10). BP is fine.Continue on rocephin and cipro. Renal function stable  Hyponatremia: 125-128-133.  Improving Dementia: seems to be at baseline, on dual therapy  Gait disorder: long standing. At risk for falls. PT/OT/CW. Recs SNF or 24 hr care. Pneumonia: port XR suggested RLL infiltrate. CXR now showed Decreased lung volumes with worsening aeration to the lung  bases. Increase pulm toilet Leukocytosis: improved Dehydration:  reduce fluids, advance diet. If Na stable to better tomorrow - Hep Lock  Anemia: doing well 12.5,  Thrombocytopenia better Hypothyroid: 0.51 and fine Hypertension: meds on hold. Restart when needed. Depression: continue Rx  GERD (gastroesophageal reflux disease): on Rx  Diarrhea: No more BM at all, doubt C diff. Stop flagyl. No Isolation needed. COPD (chronic obstructive pulmonary disease): sats OK  On Probiotics.  ID -  Anti-infectives     Start     Dose/Rate Route Frequency Ordered Stop   05/08/12 2200   cefTRIAXone (ROCEPHIN) 1 g in dextrose 5 % 50 mL IVPB        1 g 100 mL/hr over 30 Minutes Intravenous Daily at bedtime 05/08/12 0150     05/08/12 0230   ciprofloxacin (CIPRO) IVPB 400 mg        400 mg 200 mL/hr over 60 Minutes Intravenous 2 times daily 05/08/12 0200     05/08/12 0150   metroNIDAZOLE (FLAGYL) tablet 500 mg        500 mg Oral 3 times per day 05/08/12 0150     05/07/12 2100   cefTRIAXone (ROCEPHIN) 1 g in dextrose 5 % 50 mL IVPB        1 g 100 mL/hr over 30 Minutes Intravenous  Once 05/07/12 2058 05/07/12 2157         DVT Prophylaxis - Lovenox    LOS: 2 days   Joson Sapp M 05/09/2012, 9:57 AM

## 2012-05-10 LAB — COMPREHENSIVE METABOLIC PANEL
BUN: 10 mg/dL (ref 6–23)
CO2: 30 mEq/L (ref 19–32)
Calcium: 9 mg/dL (ref 8.4–10.5)
Chloride: 98 mEq/L (ref 96–112)
Creatinine, Ser: 0.62 mg/dL (ref 0.50–1.35)
GFR calc Af Amer: >90 mL/min (ref >90)
GFR calc non Af Amer: 89 mL/min — ABNORMAL LOW (ref >90)
Glucose, Bld: 101 mg/dL — ABNORMAL HIGH (ref 70–99)
Total Bilirubin: 0.2 mg/dL — ABNORMAL LOW (ref 0.3–1.2)

## 2012-05-10 LAB — DIFFERENTIAL
Eosinophils Absolute: 0.3 10*3/uL (ref 0.0–0.7)
Lymphs Abs: 1.7 10*3/uL (ref 0.7–4.0)
Monocytes Relative: 18 % — ABNORMAL HIGH (ref 3–12)
Neutro Abs: 7.2 10*3/uL (ref 1.7–7.7)
Neutrophils Relative %: 65 % (ref 43–77)

## 2012-05-10 LAB — CBC
Hemoglobin: 13 g/dL (ref 13.0–17.0)
MCH: 31.8 pg (ref 26.0–34.0)
Platelets: 155 10*3/uL (ref 150–400)
RBC: 4.09 MIL/uL — ABNORMAL LOW (ref 4.22–5.81)
WBC: 11.1 10*3/uL — ABNORMAL HIGH (ref 4.0–10.5)

## 2012-05-10 MED ORDER — LEVOFLOXACIN 500 MG PO TABS
500.0000 mg | ORAL_TABLET | Freq: Every day | ORAL | Status: DC
  Administered 2012-05-10 – 2012-05-11 (×2): 500 mg via ORAL
  Filled 2012-05-10 (×2): qty 1

## 2012-05-10 MED ORDER — WHITE PETROLATUM GEL
Status: AC
  Administered 2012-05-10: 14:00:00
  Filled 2012-05-10: qty 5

## 2012-05-10 NOTE — Progress Notes (Signed)
Subjective: 54M admitted with weakness, Dysuria, Adult FTT, Fever/chills, confusion, and sick. Dxed in ED and admitted with a severe uti vs. Urosepsis. He was on amoxicillin the prior week for a bad tooth and had some diarrhea prior to admit.  He is pleasantly confused and no C/o this am.  No Pain. Sleepin and wearing FIO2 as I entered room.  Objective: Vital signs in last 24 hours: Temp:  [97.3 F (36.3 C)-98.3 F (36.8 C)] 97.3 F (36.3 C) (08/25 1021) Pulse Rate:  [70-82] 82  (08/25 1021) Resp:  [18] 18  (08/25 1021) BP: (116-128)/(65-75) 120/67 mmHg (08/25 1021) SpO2:  [93 %-100 %] 93 % (08/25 1021) Weight:  [83.5 kg (184 lb 1.4 oz)] 83.5 kg (184 lb 1.4 oz) (08/24 2138) Weight change: 1.3 kg (2 lb 13.9 oz) Last BM Date: 05/08/12  CBG (last 3)  No results found for this basename: GLUCAP:3 in the last 72 hours  Intake/Output from previous day:  Intake/Output Summary (Last 24 hours) at 05/10/12 1028 Last data filed at 05/10/12 0600  Gross per 24 hour  Intake   1850 ml  Output   3475 ml  Net  -1625 ml   08/24 0701 - 08/25 0700 In: 2450 [P.O.:600; I.V.:1800; IV Piggyback:50] Out: 3475 [Urine:3475]   Physical Exam General: alert, nontoxic, pleasantly confused. Right eye ptosis and esotropia.  Neck supple.  Lungs distant, no wheeze.  Ht regular with murmur.  abd non distended, soft, NT.  Extrems: no edema.     Lab Results:  [facility] 05/10/12 0515 05/09/12 0530  NA 135 133*  K 4.7 4.4  CL 98 97  CO2 30 27  GLUCOSE 101* 115*  BUN 10 10  CREATININE 0.62 0.74  CALCIUM 9.0 8.8  MG -- --  PHOS -- --     Basename 05/10/12 0515 05/09/12 0530  AST 18 20  ALT 17 16  ALKPHOS 67 66  BILITOT 0.2* 0.1*  PROT 5.7* 5.6*  ALBUMIN 2.6* 2.5*     Basename 05/10/12 0515 05/09/12 0530  WBC 11.1* 10.4  NEUTROABS 7.2 6.3  HGB 13.0 12.5*  HCT 37.8* 35.5*  MCV 92.4 92.4  PLT 155 127*    No results found for this basename: INR,  PROTIME    No results found for  this basename: CKTOTAL:3,CKMB:3,CKMBINDEX:3,TROPONINI:3 in the last 72 hours   Basename 05/09/12 0530  TSH 0.743  T4TOTAL --  T3FREE --  THYROIDAB --    No results found for this basename: VITAMINB12:2,FOLATE:2,FERRITIN:2,TIBC:2,IRON:2,RETICCTPCT:2 in the last 72 hours  Micro Results: Recent Results (from the past 240 hour(s))  CULTURE, BLOOD (ROUTINE X 2)     Status: Normal (Preliminary result)   Collection Time   05/07/12  7:00 PM      Component Value Range Status Comment   Specimen Description BLOOD RIGHT ARM   Final    Special Requests BOTTLES DRAWN AEROBIC AND ANAEROBIC   Final    Culture  Setup Time 05/08/2012 03:03   Final    Culture     Final    Value:        BLOOD CULTURE RECEIVED NO GROWTH TO DATE CULTURE WILL BE HELD FOR 5 DAYS BEFORE ISSUING A FINAL NEGATIVE REPORT   Report Status PENDING   Incomplete   CULTURE, BLOOD (ROUTINE X 2)     Status: Normal (Preliminary result)   Collection Time   05/07/12  7:15 PM      Component Value Range Status Comment   Specimen Description  BLOOD LEFT ARM   Final    Special Requests BOTTLES DRAWN AEROBIC AND ANAEROBIC   Final    Culture  Setup Time 05/08/2012 03:03   Final    Culture     Final    Value:        BLOOD CULTURE RECEIVED NO GROWTH TO DATE CULTURE WILL BE HELD FOR 5 DAYS BEFORE ISSUING A FINAL NEGATIVE REPORT   Report Status PENDING   Incomplete   URINE CULTURE     Status: Normal   Collection Time   05/07/12  8:10 PM      Component Value Range Status Comment   Specimen Description URINE, CATHETERIZED   Final    Special Requests NONE   Final    Culture  Setup Time 05/08/2012 03:27   Final    Colony Count >=100,000 COLONIES/ML   Final    Culture ESCHERICHIA COLI   Final    Report Status 05/09/2012 FINAL   Final    Organism ID, Bacteria ESCHERICHIA COLI   Final   CLOSTRIDIUM DIFFICILE BY PCR     Status: Normal   Collection Time   05/08/12 11:23 AM      Component Value Range Status Comment   C difficile by pcr  NEGATIVE  NEGATIVE Final      Studies/Results: Dg Chest 2 View  05/09/2012  *RADIOLOGY REPORT*  Clinical Data: Follow up right lower lobe pneumonia. Cough.  CHEST - 2 VIEW  Comparison: 05/07/2012  Findings: Heart size appears mildly enlarged.  The lung volumes are low.  Bilateral basilar atelectasis and/or airspace opacities are noted.  Compared with previous exam and the overall aeration the lung bases is worsening.  IMPRESSION:  1.  Decreased lung volumes with worsening aeration to the lung bases.   Original Report Authenticated By: Rosealee Albee, M.D.      Medications: Scheduled:    . acidophilus  1 capsule Oral Daily  . cefTRIAXone (ROCEPHIN)  IV  1 g Intravenous QHS  . donepezil  10 mg Oral QHS  . enoxaparin (LOVENOX) injection  40 mg Subcutaneous Daily  . levothyroxine  175 mcg Oral Q breakfast  . memantine  10 mg Oral BID  . venlafaxine XR  75 mg Oral Daily  . Vitamin D (Ergocalciferol)  50,000 Units Oral Q7 days   Continuous:    . sodium chloride 75 mL/hr at 05/09/12 1730     Assessment/Plan: Principal Problem:  *Sepsis due to urinary tract infection Active Problems:  Hyponatremia  Dementia  Gait disorder  Pneumonia  Leukocytosis  Dehydration  Anemia  Hypothyroid  Hypertension  Depression  GERD (gastroesophageal reflux disease)  Diarrhea  COPD (chronic obstructive pulmonary disease)  Sepsis due to E Coli urinary tract infection: doing well. Leukocytosis is better (20 - 13 - 10 - 11.1). BP is fine. On rocephin and cipro and can transition to oral Levaquin based on Sensitivities. Renal function stable  Hyponatremia: 125-128-133 - 135.  Improved and Nml Dementia: seems to be at baseline, on dual therapy  Gait disorder: long standing. At risk for falls. PT/OT/CW. Recs SNF or 24 hr care.  S+CSW looking at SNF placement Pneumonia: port XR suggested RLL infiltrate. CXR now showed Decreased lung volumes with worsening aeration to the lung bases.. No clinical PNA     Increase pulm toilet Leukocytosis: improved Dehydration: - Hep Lock  No Anemia or thrombocytopenia today Hypothyroid: 0.51 and fine Hypertension: meds on hold. Restart when needed. Depression: continue Rx  GERD (  gastroesophageal reflux disease): on Rx  Diarrhea: No more BM at all, doubt C diff. off flagyl. No Isolation needed. COPD (chronic obstructive pulmonary disease): sats OK. Wean FIO2. On Probiotics.  ID -  Anti-infectives     Start     Dose/Rate Route Frequency Ordered Stop   05/08/12 2200   cefTRIAXone (ROCEPHIN) 1 g in dextrose 5 % 50 mL IVPB        1 g 100 mL/hr over 30 Minutes Intravenous Daily at bedtime 05/08/12 0150     05/08/12 0230   ciprofloxacin (CIPRO) IVPB 400 mg  Status:  Discontinued        400 mg 200 mL/hr over 60 Minutes Intravenous 2 times daily 05/08/12 0200 05/09/12 1006   05/08/12 0150   metroNIDAZOLE (FLAGYL) tablet 500 mg  Status:  Discontinued        500 mg Oral 3 times per day 05/08/12 0150 05/09/12 1006   05/07/12 2100   cefTRIAXone (ROCEPHIN) 1 g in dextrose 5 % 50 mL IVPB        1 g 100 mL/hr over 30 Minutes Intravenous  Once 05/07/12 2058 05/07/12 2157         DVT Prophylaxis - Lovenox    LOS: 3 days   Gar Glance M 05/10/2012, 10:28 AM

## 2012-05-10 NOTE — Progress Notes (Addendum)
Clinical Social Work Department CLINICAL SOCIAL WORK PLACEMENT NOTE 05/10/2012  Patient:  Corey Farmer, Corey Farmer  Account Number:  [id number] Admit date:  05/07/2012  Clinical Social Worker: [name], Theresia Majors,  Date/time:  05/10/2012 09:27 AM  Clinical Social Work is seeking post-discharge placement for this patient at the following level of care:   SKILLED NURSING   (*CSW will update this form in Epic as items are completed)   05/10/2012  Patient/family provided with [facility] Department of Clinical Social Work's list of facilities offering this level of care within the geographic area requested by the patient (or if unable, by the patient's family).  05/10/2012  Patient/family informed of their freedom to choose among providers that offer the needed level of care, that participate in Medicare, Medicaid or managed care program needed by the patient, have an available bed and are willing to accept the patient.  05/10/2012  Patient/family informed of [facility]' ownership interest in [facility], as well as of the fact that they are under no obligation to receive care at this facility.  05/10/2012  PASARR submitted to EDS on  05/10/2012  PASARR number received from EDS on   05/10/2012  FL2 transmitted to all facilities in geographic area requested by pt/family on   FL2 transmitted to all facilities within larger geographic area on   Patient informed that his/her managed care company has contracts with or will negotiate with  certain facilities, including the following:     Patient/family informed of bed offers received: 05/11/12  Patient chooses bed at [facility] Physician recommends and patient chooses bed at    Patient to be transferred to [facility] on 05/11/12  Patient to be transferred to facility by ambulance  The following physician request were entered in Epic:   Additional Comments:  [name], LCSWA [facility] Clinical Social  Worker Contact #: [phone] (weekend)

## 2012-05-10 NOTE — Progress Notes (Signed)
Clinical Social Work Department BRIEF PSYCHOSOCIAL ASSESSMENT 05/10/2012  Patient:  Corey Farmer, Corey Farmer     Account Number:  [id number]     Admit date:  05/07/2012  Clinical Social Worker: Corey Farmer,LCSWA  Date/Time:  05/10/2012 10:00 M  Referred by:  Physician  Date Referred:  05/09/2012 Referred for  SNF Placement    Interview type:  Family  PSYCHOSOCIAL DATA Living Status:  FAMILY   Primary support name:  Corey Farmer Primary support relationship to patient:  CHILD, ADULT Degree of support available:   Vested, strong. Participating in d/c plan discussions.    CURRENT CONCERNS  Other Concerns:    SOCIAL WORK ASSESSMENT / PLAN CSW consulted by MD to facilitate a SNF referral at d/c. CSW contacted Corey Farmer [phone]), the patient's daughter due to patient's current altered mental status. The patient's daughter asked appropriate questions, and was agreeable to a SNF placement. CSW provided supportive counseling on the patient's current medical status. CSW will move forward with a SNF search. Weekday CSW will follow up with patient's daughter.   Assessment/plan status:   Other assessment/ plan:   Information/referral to community resources:    PATIENT'S/FAMILY'S RESPONSE TO PLAN OF CARE: Agreeable and thanked CSW for support and help facilitating SNF placement.    Corey Farmer, LCSWA [facility] Clinical Social Worker Contact #: [phone] (weekend)

## 2012-05-11 LAB — COMPREHENSIVE METABOLIC PANEL
ALT: 21 U/L (ref 0–53)
Albumin: 2.7 g/dL — ABNORMAL LOW (ref 3.5–5.2)
Alkaline Phosphatase: 63 U/L (ref 39–117)
BUN: 9 mg/dL (ref 6–23)
Chloride: 95 mEq/L — ABNORMAL LOW (ref 96–112)
GFR calc Af Amer: >90 mL/min (ref >90)
Glucose, Bld: 95 mg/dL (ref 70–99)
Potassium: 4.7 mEq/L (ref 3.5–5.1)
Sodium: 130 mEq/L — ABNORMAL LOW (ref 135–145)
Total Bilirubin: 0.2 mg/dL — ABNORMAL LOW (ref 0.3–1.2)
Total Protein: 5.8 g/dL — ABNORMAL LOW (ref 6.0–8.3)

## 2012-05-11 LAB — CBC
HCT: 37.6 % — ABNORMAL LOW (ref 39.0–52.0)
MCHC: 34.6 g/dL (ref 30.0–36.0)
MCV: 91.3 fL (ref 78.0–100.0)
Platelets: 180 10*3/uL (ref 150–400)
RDW: 12.4 % (ref 11.5–15.5)

## 2012-05-11 LAB — DIFFERENTIAL
Basophils Absolute: 0 10*3/uL (ref 0.0–0.1)
Basophils Relative: 0 % (ref 0–1)
Eosinophils Relative: 2 % (ref 0–5)
Monocytes Absolute: 1.7 10*3/uL — ABNORMAL HIGH (ref 0.1–1.0)

## 2012-05-11 MED ORDER — ENOXAPARIN SODIUM 40 MG/0.4ML ~~LOC~~ SOLN: 40.0000 mg | Freq: Every day | SUBCUTANEOUS | Status: DC

## 2012-05-11 MED ORDER — LEVOFLOXACIN 500 MG PO TABS: 500.0000 mg | ORAL_TABLET | Freq: Every day | ORAL | Status: AC

## 2012-05-11 MED ORDER — RISAQUAD PO CAPS: 1.0000 | ORAL_CAPSULE | Freq: Every day | ORAL | Status: DC

## 2012-05-11 NOTE — Clinical Social Work Note (Signed)
Mr. Laday medically ready for d/c today to [facility]. Discharge information forwarded to facility. Daughter completed admissions paperwork at [name] and CSW facilitated transport to facility via ambulance.  Genelle Bal, MSW, LCSW [phone]

## 2012-05-11 NOTE — Discharge Summary (Signed)
DISCHARGE SUMMARY  Corey Farmer  MR#: [id number]  DOB:Feb 18, 1926  Date of Admission: 05/07/2012 Date of Discharge: 05/11/2012  Attending Physician:Corey Farmer  Patient's GMW:NUUVO,[id number] Hessie Diener, MD  Consults:  none  Discharge Diagnoses: Principal Problem:  *Sepsis due to urinary tract infection, clinically improved, due to Escherichia coli Active Problems:  Hyponatremia, much better at 130  Dementia, stable at baseline  Gait disorder, chronic  Pneumonia, with clear x-ray now  Leukocytosis, resolved  Dehydration, resolved  Anemia, resolved  Hypothyroid  Hypertension, stable  Depression, mild  GERD (gastroesophageal reflux disease)  Diarrhea, resolved  COPD (chronic obstructive pulmonary disease),  no supplemental oxygen needed DO NOT RESUSCITATE status   Discharge Medications: Medication List  As of 05/11/2012  8:30 AM   TAKE these medications         acidophilus Caps   Take 1 capsule by mouth daily.      donepezil 10 MG tablet   Commonly known as: ARICEPT   Take 10 mg by mouth at bedtime as needed.      doxazosin 2 MG tablet   Commonly known as: CARDURA   Take 2 mg by mouth at bedtime.      enoxaparin 40 MG/0.4ML injection   Commonly known as: LOVENOX   Inject 0.4 mLs (40 mg total) into the skin daily.      levofloxacin 500 MG tablet   Commonly known as: LEVAQUIN   Take 1 tablet (500 mg total) by mouth daily.      levothyroxine 175 MCG tablet   Commonly known as: SYNTHROID, LEVOTHROID   Take by mouth daily.      lisinopril 10 MG tablet   Commonly known as: PRINIVIL,ZESTRIL   Take 10 mg by mouth daily.      memantine 10 MG tablet   Commonly known as: NAMENDA   Take 20 mg by mouth daily.      simvastatin 40 MG tablet   Commonly known as: ZOCOR   Take 40 mg by mouth every evening.      venlafaxine XR 75 MG 24 hr capsule   Commonly known as: EFFEXOR-XR   Take 75 mg by mouth daily.      Vitamin D (Ergocalciferol) 50000 UNITS Caps   Commonly known as: DRISDOL   Take 50,000 Units by mouth every 7 (seven) days.            Hospital Procedures: Dg Chest 2 View  05/09/2012  *RADIOLOGY REPORT*  Clinical Data: Follow up right lower lobe pneumonia. Cough.  CHEST - 2 VIEW  Comparison: 05/07/2012  Findings: Heart size appears mildly enlarged.  The lung volumes are low.  Bilateral basilar atelectasis and/or airspace opacities are noted.  Compared with previous exam and the overall aeration the lung bases is worsening.  IMPRESSION:  1.  Decreased lung volumes with worsening aeration to the lung bases.   Original Report Authenticated By: Rosealee Albee, M.D.    Dg Chest Port 1 View  05/07/2012  *RADIOLOGY REPORT*  Clinical Data: Fever, hypertension.  PORTABLE CHEST - 1 VIEW  Comparison: None.  The patient's prior films from 2003 are not available for comparison.  Findings: There is calcified pleural plaque identified.  The lungs are hyperinflated.  There is patchy opacity in bilateral lung bases which are least in part due to atelectasis but superimposed pneumonia particularly in the right lung base is not excluded. There is mild left cyst versus scar in the lateral right mid lung. The aorta is tortuous.  The  heart size is normal.  There is scoliosis of spine.  IMPRESSION: Changes of COPD. There is patchy opacity in bilateral lung bases which are least in part due to atelectasis but superimposed pneumonia particularly in the right lung base is not excluded   Original Report Authenticated By: Sherian Rein, M.D.     History of Present Illness: Fever and weakness  Hospital Course: This is an 78 year old white male who presented to my partner with fever weakness and what turned out to be sepsis from a urinary source. The patient was treated with broad-spectrum antibiotics which now been narrowed down to Levaquin based on the culture. His blood pressure is done well. His significant leukocytosis has resolved. His renal function remains  stable. There was a question of possible pneumonia on the first x-ray this was not confirmed on subsequent x-rays. I do not think an infiltrate would have resolved as quickly. He is eating fairly well. He had one loose stool prior to admission has had none since. A C. difficile was negative. Blood cultures were negative. He's not been very ambulatory yet and is in a weakened state. He needs short-term rehabilitation for strengthening. His dementia is mild at baseline. He did have some mild hyponatremia that is improved. No other new problems are noted. He needs physical therapy. He does have a chronic significant gait disorder is been present since the 1950s however. Day of Discharge Exam BP 126/86  Pulse 75  Temp 97.9 F (36.6 C) (Oral)  Resp 18  Ht 5\' 11"  (1.803 m)  Wt 84 kg (185 lb 3 oz)  BMI 25.83 kg/m2  SpO2 92%  Physical Exam: General appearance: alert, sitting up in no distress Eyes: no scleral icterus Throat: oropharynx moist without erythema Resp: clear to auscultation bilaterally, distant with no wheeze Cardio: regular rate and rhythm, with systolic murmur GI: soft, non-tender; bowel sounds normal; no masses,  no organomegaly Extremities: no clubbing, cyanosis or edema Neuro: Patient is awake alert recognizes me easily is in good spirits speech is clear  Discharge Labs:  [facility] 05/11/12 0517 05/10/12 0515  NA 130* 135  K 4.7 4.7  CL 95* 98  CO2 26 30  GLUCOSE 95 101*  BUN 9 10  CREATININE 0.66 0.62  CALCIUM 9.1 9.0  MG -- --  PHOS -- --    Basename 05/11/12 0517 05/10/12 0515  AST 21 18  ALT 21 17  ALKPHOS 63 67  BILITOT 0.2* 0.2*  PROT 5.8* 5.7*  ALBUMIN 2.7* 2.6*    Basename 05/11/12 0517 05/10/12 0515  WBC 9.8 11.1*  NEUTROABS 6.3 7.2  HGB 13.0 13.0  HCT 37.6* 37.8*  MCV 91.3 92.4  PLT 180 155   s  Discharge instructions:   Disposition: To skilled nursing for rehabilitation  Follow-up Appts: Follow-up with Dr. Evlyn Kanner at [facility] in 2 weeks after rehabilitation discharge  Call for appointment.  Condition on Discharge: Stable  Tests Needing Follow-up: None  Signed: Kaevon Cotta Farmer 05/11/2012, 8:30 AM

## 2012-05-11 NOTE — Progress Notes (Signed)
Physical Therapy Treatment Patient Details Name: Corey Farmer MRN: [id number] DOB: August 15, 1926 Today's Date: 05/11/2012 Time: 4098-1191 PT Time Calculation (min): 30 min  PT Assessment / Plan / Recommendation Comments on Treatment Session  Admitted with urosepsis; Making gains in functional mobility; Plan for SNF for rehab to maximize independence and safety prior to dc home    Follow Up Recommendations  Skilled nursing facility;Other (comment);Supervision/Assistance - 24 hour    Barriers to Discharge        Equipment Recommendations  Defer to next venue    Recommendations for Other Services    Frequency Min 3X/week   Plan Discharge plan remains appropriate    Precautions / Restrictions Precautions Precautions: Fall Restrictions Weight Bearing Restrictions: No   Pertinent Vitals/Pain Ambulated on Room Air, and O2 sats remained greater than or equal to 96%; RN notified    Mobility  Transfers Transfers: Sit to Stand;Stand to Sit Sit to Stand: 4: Min assist;With armrests;From chair/3-in-1;With upper extremity assist Stand to Sit: 4: Min assist;With upper extremity assist;With armrests;To chair/3-in-1 Details for Transfer Assistance: verbal cues for hand placement for sit to stand and stand to sit; also cues for safety and to control descent for stnad to sit Ambulation/Gait Ambulation/Gait Assistance: 1: +2 Total assist Ambulation/Gait: Patient Percentage: 60% Ambulation Distance (Feet): 110 Feet Assistive device: Rolling walker Ambulation/Gait Assistance Details: Verbal and tactile cueing for upright posture; continued steppage gait L foot (I asked pt if he has a "brace", meaning AFO, for L foot, and he didn't seem to know); Overall imporving mobility, with increased gait distance; second person presnt for safety, and to push chair behind Gait Pattern: Left steppage;Decreased step length - right;Decreased step length - left;Trunk flexed    Exercises     PT Diagnosis:    PT  Problem List:   PT Treatment Interventions:     PT Goals Acute Rehab PT Goals Time For Goal Achievement: 05/22/12 Potential to Achieve Goals: Good Pt will go Sit to Stand: with supervision PT Goal: Sit to Stand - Progress: Progressing toward goal Pt will go Stand to Sit: with supervision PT Goal: Stand to Sit - Progress: Progressing toward goal Pt will Ambulate: 51 - 150 feet;with supervision PT Goal: Ambulate - Progress: Progressing toward goal  Visit Information  Last PT Received On: 05/11/12 Assistance Needed: +2    Subjective Data  Subjective: Really wanting to go home with daughter, Corey Farmer Patient Stated Goal: Home with Corey Farmer  Overall Cognitive Status: Impaired Area of Impairment: Safety/judgement Arousal/Alertness: Awake/alert Orientation Level: Disoriented to;Situation Behavior During Session: [facility] for tasks performed Current Attention Level: Sustained;Other (comment) Memory: Decreased recall of precautions Memory Deficits: Requires frequent reminding of plan for rehab to get stronger in order to go home with daughter Corey Farmer Following Commands: Follows one step commands consistently Safety/Judgement: Impulsive;Decreased safety judgement for tasks assessed    Balance     End of Session PT - End of Session Equipment Utilized During Treatment: Gait belt Activity Tolerance: Patient tolerated treatment well;Other (comment) (continues to be easily distracted) Patient left: in chair;with chair alarm set;with call bell/phone within reach Nurse Communication: Mobility status   Corey Farmer     Corey Farmer Corey Farmer Corey Farmer, Corey Farmer [phone]  05/11/2012, 12:21 PM

## 2012-05-11 NOTE — Progress Notes (Signed)
IV was d/c,telemetry was d/c,foley was d/c,report was call to [facility]. Ready to leave by ambulance.

## 2012-05-14 LAB — CULTURE, BLOOD (ROUTINE X 2): Culture: NO GROWTH

## 2013-07-14 IMAGING — CR DG CHEST 2V
2 series · 2 of 2 positions shown · non-contrast
Comparison: 05/07/2012

CLINICAL DATA: Follow up right lower lobe pneumonia. Cough.

CHEST - 2 VIEW

[w chest lat (1 of 2)]
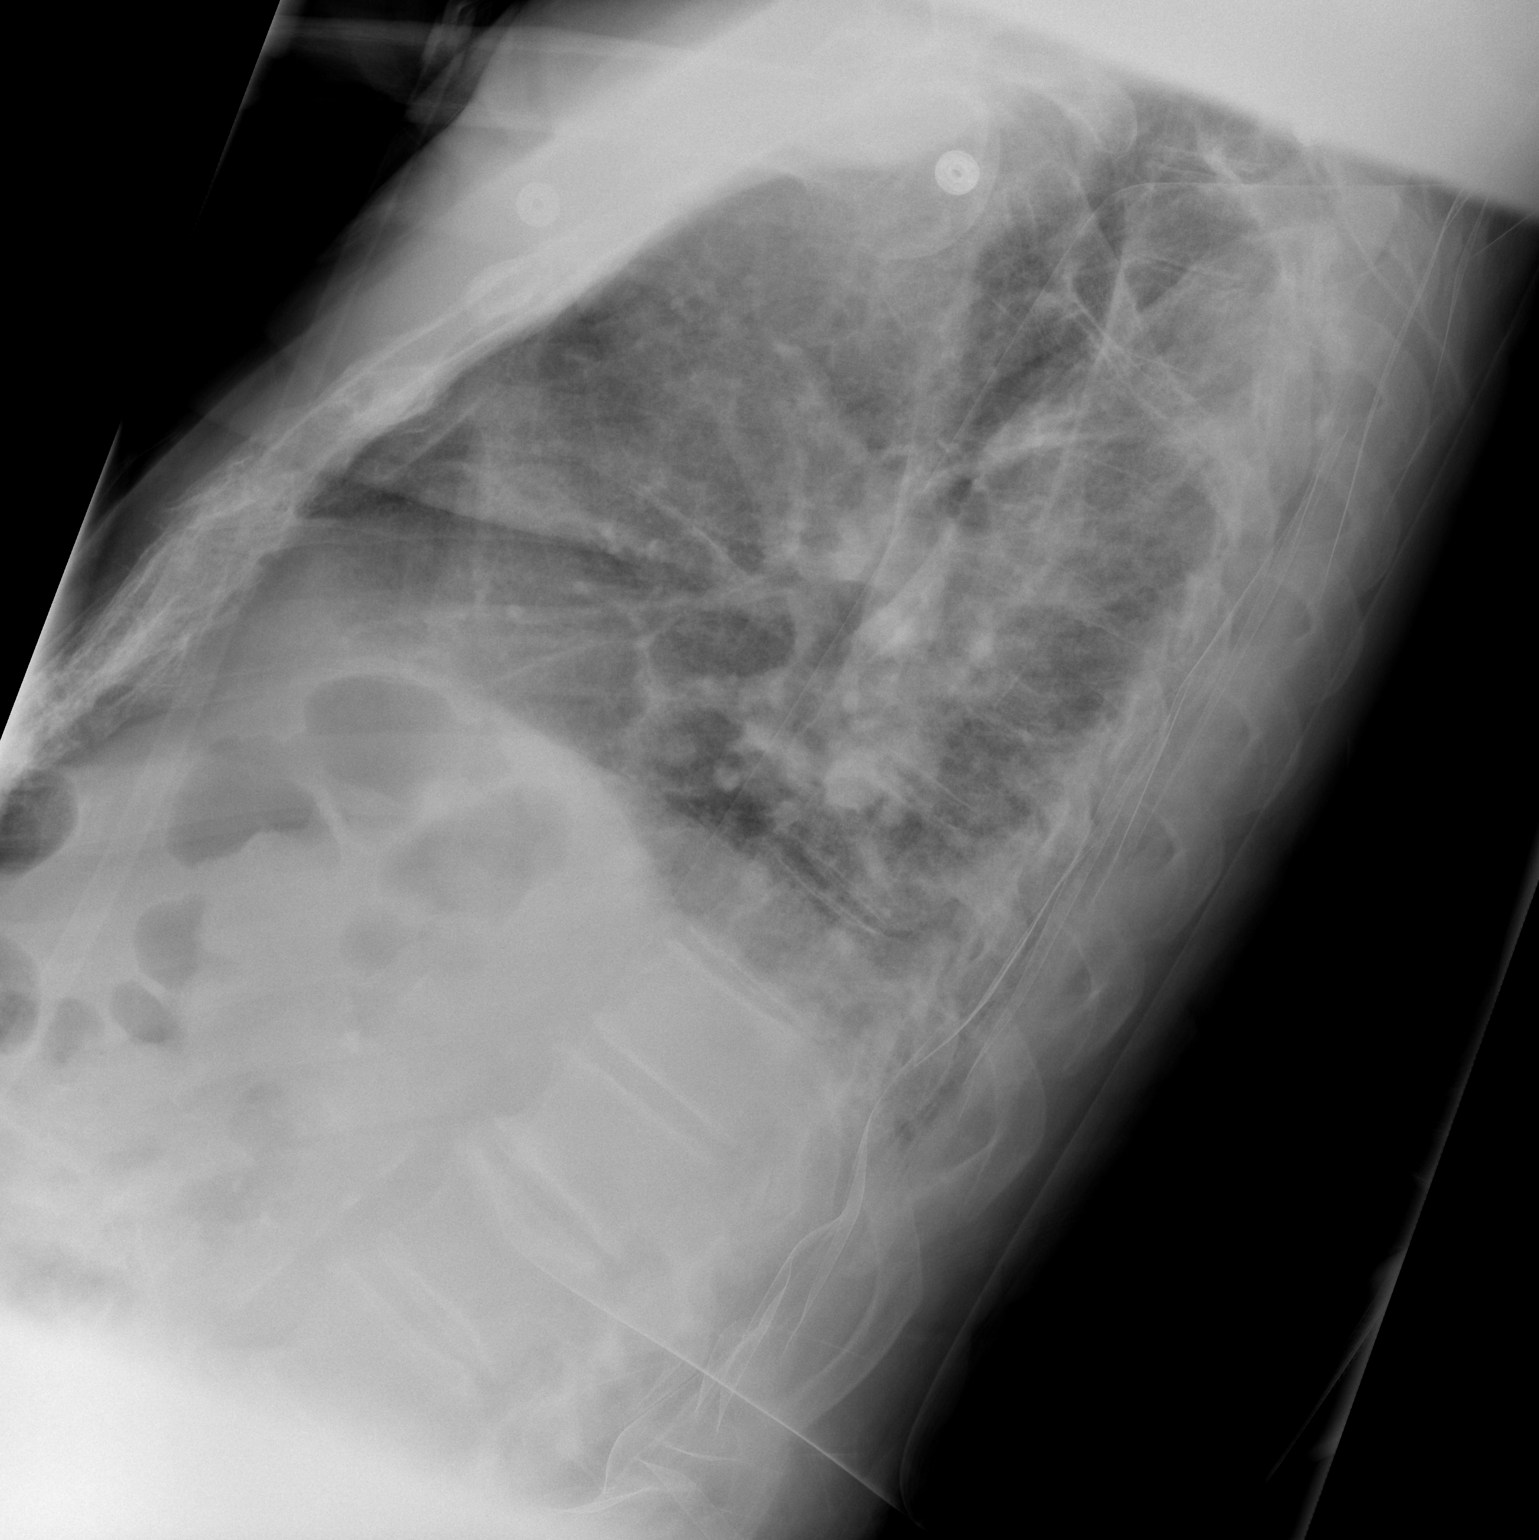

[w chest lat (2 of 2)]
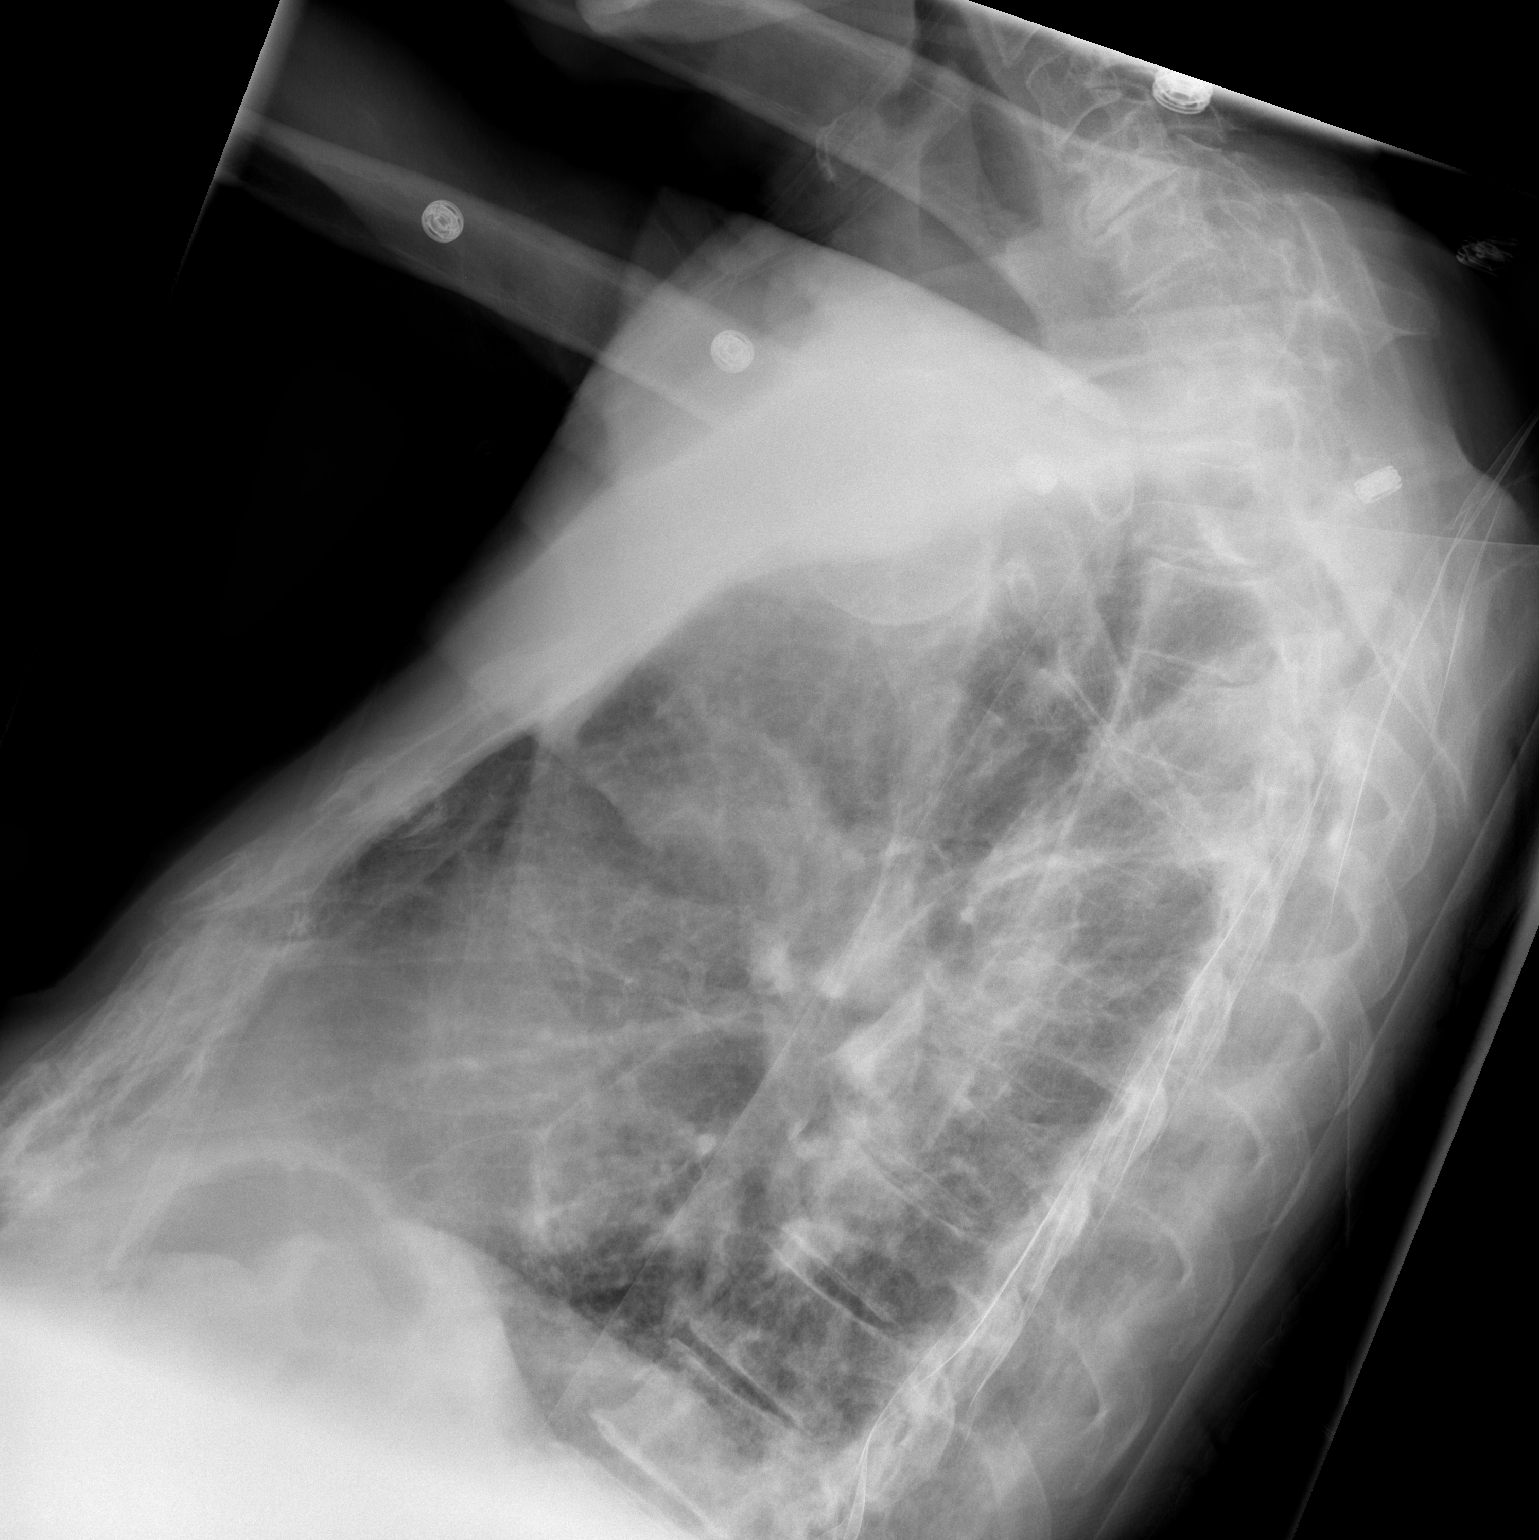

[2 of 2 positions shown; findings below may reference images not displayed]

FINDINGS: Heart size appears mildly enlarged.  The lung volumes are
low.  Bilateral basilar atelectasis and/or airspace opacities are
noted.  Compared with previous exam and the overall aeration the
lung bases is worsening.
IMPRESSION: 1.  Decreased lung volumes with worsening aeration to the lung
bases.

## 2014-01-24 ENCOUNTER — Ambulatory Visit (INDEPENDENT_AMBULATORY_CARE_PROVIDER_SITE_OTHER): Payer: Medicare Other | Admitting: Podiatry

## 2014-01-24 ENCOUNTER — Encounter: Payer: Self-pay | Admitting: Podiatry

## 2014-01-24 VITALS — BP 113/69 | HR 81 | Resp 16

## 2014-01-24 DIAGNOSIS — M79609 Pain in unspecified limb: Secondary | ICD-10-CM

## 2014-01-24 DIAGNOSIS — B351 Tinea unguium: Secondary | ICD-10-CM

## 2014-01-24 NOTE — Progress Notes (Signed)
Subjective:     Patient ID: Corey Farmer, male   DOB: Sep 18, 1925, 80 y.o.   MRN: [id number]  HPI patient presents with nail disease and thickness 1-5 both feet that he cannot cut   Review of Systems     Objective:   Physical Exam Neurovascular status unchanged with thick yellow brittle nailbeds 1-5 both feet    Assessment:     Mycotic nail infection with pain 1-5 both feet    Plan:     Debridement of painful nailbeds 1-5 both feet with no iatrogenic bleeding noted

## 2014-05-02 ENCOUNTER — Other Ambulatory Visit: Payer: 59

## 2014-10-30 ENCOUNTER — Emergency Department (HOSPITAL_COMMUNITY): Payer: Medicare Other

## 2014-10-30 ENCOUNTER — Inpatient Hospital Stay (HOSPITAL_COMMUNITY)
Admission: EM | Admit: 2014-10-30 | Discharge: 2014-11-15 | DRG: 193 | Disposition: E | Payer: Medicare Other | Attending: Endocrinology | Admitting: Endocrinology

## 2014-10-30 DIAGNOSIS — E039 Hypothyroidism, unspecified: Secondary | ICD-10-CM | POA: Diagnosis present

## 2014-10-30 DIAGNOSIS — G309 Alzheimer's disease, unspecified: Secondary | ICD-10-CM | POA: Diagnosis present

## 2014-10-30 DIAGNOSIS — E871 Hypo-osmolality and hyponatremia: Secondary | ICD-10-CM | POA: Diagnosis present

## 2014-10-30 DIAGNOSIS — F0280 Dementia in other diseases classified elsewhere without behavioral disturbance: Secondary | ICD-10-CM | POA: Diagnosis present

## 2014-10-30 DIAGNOSIS — Z66 Do not resuscitate: Secondary | ICD-10-CM

## 2014-10-30 DIAGNOSIS — Z515 Encounter for palliative care: Secondary | ICD-10-CM | POA: Diagnosis not present

## 2014-10-30 DIAGNOSIS — R1313 Dysphagia, pharyngeal phase: Secondary | ICD-10-CM | POA: Diagnosis present

## 2014-10-30 DIAGNOSIS — Z79899 Other long term (current) drug therapy: Secondary | ICD-10-CM | POA: Diagnosis not present

## 2014-10-30 DIAGNOSIS — J9601 Acute respiratory failure with hypoxia: Secondary | ICD-10-CM | POA: Diagnosis present

## 2014-10-30 DIAGNOSIS — J189 Pneumonia, unspecified organism: Secondary | ICD-10-CM | POA: Diagnosis present

## 2014-10-30 DIAGNOSIS — J159 Unspecified bacterial pneumonia: Secondary | ICD-10-CM | POA: Diagnosis not present

## 2014-10-30 DIAGNOSIS — J9801 Acute bronchospasm: Secondary | ICD-10-CM | POA: Diagnosis present

## 2014-10-30 DIAGNOSIS — R269 Unspecified abnormalities of gait and mobility: Secondary | ICD-10-CM | POA: Diagnosis present

## 2014-10-30 DIAGNOSIS — R0602 Shortness of breath: Secondary | ICD-10-CM | POA: Diagnosis not present

## 2014-10-30 DIAGNOSIS — E785 Hyperlipidemia, unspecified: Secondary | ICD-10-CM | POA: Diagnosis present

## 2014-10-30 DIAGNOSIS — Z87891 Personal history of nicotine dependence: Secondary | ICD-10-CM | POA: Diagnosis not present

## 2014-10-30 LAB — BLOOD GAS, ARTERIAL
Acid-Base Excess: 0.6 mmol/L (ref 0.0–2.0)
BICARBONATE: 23.8 meq/L (ref 20.0–24.0)
Drawn by: 307971
FIO2: 1 %
O2 Saturation: 94.9 %
PATIENT TEMPERATURE: 98.6
PCO2 ART: 35.1 mmHg (ref 35.0–45.0)
PH ART: 7.446 (ref 7.350–7.450)
TCO2: 21.2 mmol/L (ref 0–100)
pO2, Arterial: 76.9 mmHg — ABNORMAL LOW (ref 80.0–100.0)

## 2014-10-30 LAB — PHOSPHORUS: PHOSPHORUS: 4.2 mg/dL (ref 2.3–4.6)

## 2014-10-30 LAB — I-STAT TROPONIN, ED: Troponin i, poc: 0.01 ng/mL (ref 0.00–0.08)

## 2014-10-30 LAB — BASIC METABOLIC PANEL
ANION GAP: 7 (ref 5–15)
BUN: 14 mg/dL (ref 6–23)
CHLORIDE: 94 mmol/L — AB (ref 96–112)
CO2: 26 mmol/L (ref 19–32)
Calcium: 9 mg/dL (ref 8.4–10.5)
Creatinine, Ser: 0.66 mg/dL (ref 0.50–1.35)
GFR, EST NON AFRICAN AMERICAN: 85 mL/min — AB (ref >90)
GLUCOSE: 103 mg/dL — AB (ref 70–99)
Potassium: 4.3 mmol/L (ref 3.5–5.1)
SODIUM: 127 mmol/L — AB (ref 135–145)

## 2014-10-30 LAB — URINALYSIS, ROUTINE W REFLEX MICROSCOPIC
BILIRUBIN URINE: NEGATIVE
GLUCOSE, UA: NEGATIVE mg/dL
HGB URINE DIPSTICK: NEGATIVE
KETONES UR: NEGATIVE mg/dL
Leukocytes, UA: NEGATIVE
Nitrite: NEGATIVE
PH: 6 (ref 5.0–8.0)
Protein, ur: 100 mg/dL — AB
SPECIFIC GRAVITY, URINE: 1.021 (ref 1.005–1.030)
UROBILINOGEN UA: 2 mg/dL — AB (ref 0.0–1.0)

## 2014-10-30 LAB — CBC
HEMATOCRIT: 36.8 % — AB (ref 39.0–52.0)
Hemoglobin: 12.8 g/dL — ABNORMAL LOW (ref 13.0–17.0)
MCH: 31.6 pg (ref 26.0–34.0)
MCHC: 34.8 g/dL (ref 30.0–36.0)
MCV: 90.9 fL (ref 78.0–100.0)
PLATELETS: 264 10*3/uL (ref 150–400)
RBC: 4.05 MIL/uL — ABNORMAL LOW (ref 4.22–5.81)
RDW: 12.4 % (ref 11.5–15.5)
WBC: 11.9 10*3/uL — ABNORMAL HIGH (ref 4.0–10.5)

## 2014-10-30 LAB — I-STAT CG4 LACTIC ACID, ED
LACTIC ACID, VENOUS: 1.15 mmol/L (ref 0.5–2.0)
Lactic Acid, Venous: 1.86 mmol/L (ref 0.5–2.0)

## 2014-10-30 LAB — MAGNESIUM: MAGNESIUM: 1.6 mg/dL (ref 1.5–2.5)

## 2014-10-30 LAB — TSH: TSH: 0.683 u[IU]/mL (ref 0.350–4.500)

## 2014-10-30 LAB — URINE MICROSCOPIC-ADD ON

## 2014-10-30 LAB — BRAIN NATRIURETIC PEPTIDE: B Natriuretic Peptide: 105.1 pg/mL — ABNORMAL HIGH (ref 0.0–100.0)

## 2014-10-30 MED ORDER — VENLAFAXINE HCL ER 75 MG PO CP24
75.0000 mg | ORAL_CAPSULE | Freq: Every day | ORAL | Status: DC
  Administered 2014-10-30 – 2014-11-01 (×3): 75 mg via ORAL
  Filled 2014-10-30 (×5): qty 1

## 2014-10-30 MED ORDER — ENOXAPARIN SODIUM 40 MG/0.4ML ~~LOC~~ SOLN
40.0000 mg | SUBCUTANEOUS | Status: DC
  Administered 2014-10-30 – 2014-11-02 (×4): 40 mg via SUBCUTANEOUS
  Filled 2014-10-30 (×4): qty 0.4

## 2014-10-30 MED ORDER — ACETAMINOPHEN 325 MG PO TABS: 650.0000 mg | ORAL_TABLET | Freq: Four times a day (QID) | ORAL | Status: DC | PRN

## 2014-10-30 MED ORDER — BUDESONIDE 0.5 MG/2ML IN SUSP
0.5000 mg | Freq: Two times a day (BID) | RESPIRATORY_TRACT | Status: DC
  Administered 2014-10-30 – 2014-11-03 (×8): 0.5 mg via RESPIRATORY_TRACT
  Filled 2014-10-30 (×8): qty 2

## 2014-10-30 MED ORDER — CHLORHEXIDINE GLUCONATE 0.12 % MT SOLN
15.0000 mL | Freq: Two times a day (BID) | OROMUCOSAL | Status: DC
  Administered 2014-10-31 – 2014-11-02 (×6): 15 mL via OROMUCOSAL
  Filled 2014-10-30 (×6): qty 15

## 2014-10-30 MED ORDER — SIMVASTATIN 10 MG PO TABS
20.0000 mg | ORAL_TABLET | Freq: Every evening | ORAL | Status: DC
  Administered 2014-10-30 – 2014-11-02 (×4): 20 mg via ORAL
  Filled 2014-10-30 (×5): qty 2

## 2014-10-30 MED ORDER — PANTOPRAZOLE SODIUM 40 MG PO TBEC
40.0000 mg | DELAYED_RELEASE_TABLET | Freq: Every day | ORAL | Status: DC
  Administered 2014-10-31 – 2014-11-01 (×2): 40 mg via ORAL
  Filled 2014-10-30 (×2): qty 1

## 2014-10-30 MED ORDER — PIPERACILLIN-TAZOBACTAM 3.375 G IVPB
3.3750 g | Freq: Once | INTRAVENOUS | Status: AC
  Administered 2014-10-30: 3.375 g via INTRAVENOUS
  Filled 2014-10-30: qty 50

## 2014-10-30 MED ORDER — SACCHAROMYCES BOULARDII 250 MG PO CAPS
500.0000 mg | ORAL_CAPSULE | Freq: Two times a day (BID) | ORAL | Status: DC
  Administered 2014-10-30 – 2014-11-02 (×6): 500 mg via ORAL
  Filled 2014-10-30 (×9): qty 2

## 2014-10-30 MED ORDER — LEVOTHYROXINE SODIUM 75 MCG PO TABS
175.0000 ug | ORAL_TABLET | Freq: Every day | ORAL | Status: DC
  Administered 2014-10-31 – 2014-11-02 (×3): 175 ug via ORAL
  Filled 2014-10-30: qty 2
  Filled 2014-10-30 (×2): qty 1
  Filled 2014-10-30: qty 2
  Filled 2014-10-30: qty 1
  Filled 2014-10-30: qty 2

## 2014-10-30 MED ORDER — ONDANSETRON HCL 4 MG/2ML IJ SOLN: 4.0000 mg | Freq: Four times a day (QID) | INTRAMUSCULAR | Status: DC | PRN

## 2014-10-30 MED ORDER — MEMANTINE HCL 5 MG PO TABS
10.0000 mg | ORAL_TABLET | Freq: Two times a day (BID) | ORAL | Status: DC
  Administered 2014-10-30 – 2014-11-02 (×6): 10 mg via ORAL
  Filled 2014-10-30 (×6): qty 2

## 2014-10-30 MED ORDER — ONDANSETRON HCL 4 MG PO TABS: 4.0000 mg | ORAL_TABLET | Freq: Four times a day (QID) | ORAL | Status: DC | PRN

## 2014-10-30 MED ORDER — OMEPRAZOLE MAGNESIUM 20 MG PO TBEC: 20.0000 mg | DELAYED_RELEASE_TABLET | Freq: Every day | ORAL | Status: DC

## 2014-10-30 MED ORDER — SODIUM CHLORIDE 0.9 % IV SOLN
INTRAVENOUS | Status: DC
  Administered 2014-10-30 – 2014-11-01 (×2): via INTRAVENOUS

## 2014-10-30 MED ORDER — PIPERACILLIN-TAZOBACTAM 3.375 G IVPB
3.3750 g | Freq: Three times a day (TID) | INTRAVENOUS | Status: DC
  Administered 2014-10-30 – 2014-11-03 (×11): 3.375 g via INTRAVENOUS
  Filled 2014-10-30 (×8): qty 50

## 2014-10-30 MED ORDER — ALBUTEROL SULFATE (2.5 MG/3ML) 0.083% IN NEBU
2.5000 mg | INHALATION_SOLUTION | RESPIRATORY_TRACT | Status: DC
  Administered 2014-10-30 – 2014-11-03 (×22): 2.5 mg via RESPIRATORY_TRACT
  Filled 2014-10-30 (×23): qty 3

## 2014-10-30 MED ORDER — VANCOMYCIN HCL IN DEXTROSE 1-5 GM/200ML-% IV SOLN
1000.0000 mg | Freq: Once | INTRAVENOUS | Status: AC
  Administered 2014-10-30: 1000 mg via INTRAVENOUS
  Filled 2014-10-30: qty 200

## 2014-10-30 MED ORDER — DONEPEZIL HCL 10 MG PO TABS
10.0000 mg | ORAL_TABLET | Freq: Every day | ORAL | Status: DC
  Administered 2014-10-30 – 2014-11-02 (×4): 10 mg via ORAL
  Filled 2014-10-30 (×4): qty 1

## 2014-10-30 MED ORDER — VANCOMYCIN HCL 10 G IV SOLR
1250.0000 mg | Freq: Two times a day (BID) | INTRAVENOUS | Status: DC
  Administered 2014-10-31 – 2014-11-02 (×5): 1250 mg via INTRAVENOUS
  Filled 2014-10-30 (×6): qty 1250

## 2014-10-30 MED ORDER — CETYLPYRIDINIUM CHLORIDE 0.05 % MT LIQD
7.0000 mL | Freq: Two times a day (BID) | OROMUCOSAL | Status: DC
Start: 2014-10-31 — End: 2014-11-03
  Administered 2014-10-31 – 2014-11-02 (×6): 7 mL via OROMUCOSAL

## 2014-10-30 MED ORDER — ACETAMINOPHEN 650 MG RE SUPP: 650.0000 mg | Freq: Four times a day (QID) | RECTAL | Status: DC | PRN

## 2014-10-30 NOTE — ED Notes (Signed)
Per EMS. Pt from home, hx dementia. Family reports pt has had increased weakness and confusion for the past 4 days. Family recently got over a cold. O2 sats 83% on RA. Put pt on 6L [place] which increased O2 sats to 90. Pt has audible rhonchi and coughed up blood tinged sputum with EMS. Family reports pt had similar symptoms 3 years ago and was diagnosed with a UTI.

## 2014-10-30 NOTE — ED Notes (Signed)
Bed: WA09 Expected date: Jan 05, 2015 Expected time: 12:29 PM Means of arrival: Ambulance Comments: weakness

## 2014-10-30 NOTE — ED Notes (Signed)
Lab troponin cancelled by dr Manus Gunningancour...klj

## 2014-10-30 NOTE — Progress Notes (Signed)
ANTIBIOTIC CONSULT NOTE - INITIAL  Pharmacy Consult for Vancomycin and Zosyn Indication: pneumonia  Allergies  Allergen Reactions  . Sulfur Other (See Comments)    unknown    Patient Measurements:   Height:  Weight:  Vital Signs: Temp: 98.3 F (36.8 C) (02/14 1241) Temp Source: Oral (02/14 1241) BP: 118/58 mmHg (02/14 1609) Pulse Rate: 81 (02/14 1609) Intake/Output from previous day:   Intake/Output from this shift:    Labs:  Recent Labs  11/05/2014 1256  WBC 11.9*  HGB 12.8*  PLT 264  CREATININE 0.66   CrCl cannot be calculated (Unknown ideal weight.). No results for input(s): VANCOTROUGH, VANCOPEAK, VANCORANDOM, GENTTROUGH, GENTPEAK, GENTRANDOM, TOBRATROUGH, TOBRAPEAK, TOBRARND, AMIKACINPEAK, AMIKACINTROU, AMIKACIN in the last 72 hours.   Microbiology: No results found for this or any previous visit (from the past 720 hour(s)).  Medical History: Past Medical History  Diagnosis Date  . Alzheimer disease   . Pneumonia   . Thyroid disease   . Hypertension   . Hyperlipidemia   . Dementia     Medications:  Scheduled:  . albuterol  2.5 mg Nebulization Q4H  . budesonide  0.5 mg Nebulization BID  . donepezil  10 mg Oral QHS  . enoxaparin (LOVENOX) injection  40 mg Subcutaneous Q24H  . levothyroxine  175 mcg Oral QAC breakfast  . memantine  10 mg Oral BID  . [START ON 10/31/2014] pantoprazole  40 mg Oral Daily  . piperacillin-tazobactam (ZOSYN)  IV  3.375 g Intravenous Q8H  . saccharomyces boulardii  500 mg Oral BID  . simvastatin  20 mg Oral QPM  . venlafaxine XR  75 mg Oral Daily   Infusions:  . sodium chloride     PRN: acetaminophen **OR** acetaminophen, ondansetron **OR** ondansetron (ZOFRAN) IV  Assessment: 81 yo male with dementia who presents with SOB and hypoxia. CX shows bilateral pneumonia. He has had recent dental issues and been onamoxicillin for several weeks and had a tooth pulled ~10 days ago and has had difficulty swallowing liquids.  Pharmacy is consulted to dose vancomycin and zosyn for pneumonia and patient will be admitted to stepdown.  2/14 >> Vancomycin >> 2/14 >> Zosyn >>  Tmax: Afebrile WBC: 11.9k Renal: SCr 0.66, CrCl 66 ml/min Normalized Lactate: 1.1.5 > 16  No cultures ordered yet  Goal of Therapy:  Vancomycin trough level 15-20 mcg/ml  Zosyn dose appropriate for indication, renal function  Plan:  Zosyn 3.375gm IV q8h (4hr extended infusions) Vancomycin 1g IV x 1, then 1250mg  IV q12h Check trough at steady state Follow up renal function & cultures  Loralee PacasErin Imanni Burdine, PharmD, BCPS Pager: [phone] 10/20/2014,5:04 PM

## 2014-10-30 NOTE — ED Provider Notes (Signed)
CSN: [id number]     Arrival date & time 10/27/2014  1232 History   First MD Initiated Contact with Patient 10/26/2014 1253     Chief Complaint  Patient presents with  . Fatigue  . Cough     (Consider location/radiation/quality/duration/timing/severity/associated sxs/prior Treatment) HPI Comments: Patient from home with increased confusion and weakness over the past several days. Daughter states patient recently got over an upper respiratory infection. He is on amoxicillin chronically for the past 3 weeks due to dental infection. He has some confusion at baseline which is worse. Daughter reports he's been generally weak and not walking as much as usual. Denies any fevers, chills, nausea or vomiting. Has had a cough productive of green mucus. Found to be hypoxic for EMS he is to In mild respiratory distress. No history of COPD or asthma. No change in chronic leg swelling.  Patient is a 81 y.o. male presenting with cough. The history is provided by the patient, a caregiver and the EMS personnel. The history is limited by the condition of the patient.  Cough   Past Medical History  Diagnosis Date  . Alzheimer disease   . Pneumonia   . Thyroid disease   . Hypertension   . Hyperlipidemia   . Dementia    Past Surgical History  Procedure Laterality Date  . Hip surgery    . Lumbar laminectomy     No family history on file. History  Substance Use Topics  . Smoking status: Former Smoker    Types: Cigarettes  . Smokeless tobacco: Not on file  . Alcohol Use: 0.0 oz/week     Comment: 4 oz a night    Review of Systems  Unable to perform ROS: Dementia  Respiratory: Positive for cough.       Allergies  Sulfur  Home Medications   Prior to Admission medications   Medication Sig Start Date End Date Taking? Authorizing Provider  Ascorbic Acid (VITAMIN C PO) Take 1 tablet by mouth daily.   Yes Historical Provider, MD  Cholecalciferol (VITAMIN D PO) Take 2 tablets by mouth at bedtime.    Yes Historical Provider, MD  donepezil (ARICEPT) 10 MG tablet Take 10 mg by mouth at bedtime as needed.   Yes Historical Provider, MD  doxazosin (CARDURA) 4 MG tablet Take 2 mg by mouth daily.   Yes Historical Provider, MD  Fluticasone-Salmeterol (ADVAIR DISKUS IN) Inhale 1 puff into the lungs daily as needed (cough.).   Yes Historical Provider, MD  levothyroxine (SYNTHROID, LEVOTHROID) 175 MCG tablet Take 175 mcg by mouth daily before breakfast.   Yes Historical Provider, MD  loratadine (CLARITIN) 10 MG tablet Take 10 mg by mouth daily.   Yes Historical Provider, MD  memantine (NAMENDA) 10 MG tablet Take 10 mg by mouth 2 (two) times daily.    Yes Historical Provider, MD  omeprazole (PRILOSEC OTC) 20 MG tablet Take 20 mg by mouth daily.   Yes Historical Provider, MD  simvastatin (ZOCOR) 40 MG tablet Take 40 mg by mouth every evening.   Yes Historical Provider, MD  venlafaxine XR (EFFEXOR-XR) 75 MG 24 hr capsule Take 75 mg by mouth daily.   Yes Historical Provider, MD   BP 132/53 mmHg  Pulse 90  Temp(Src) 98.8 F (37.1 C) (Oral)  Resp 22  Ht  (1.803 m)  Wt 211 lb 3.2 oz (95.8 kg)  BMI 29.47 kg/m2  SpO2 97% Physical Exam  Constitutional: He appears well-developed and well-nourished. He appears distressed.  Mild  respiratory distress  HENT:  Head: Normocephalic and atraumatic.  Mouth/Throat: Oropharynx is clear and moist. No oropharyngeal exudate.  Eyes: Conjunctivae and EOM are normal. Pupils are equal, round, and reactive to light.  Neck: Normal range of motion. Neck supple.  Cardiovascular: Normal rate and normal heart sounds.   No murmur heard. Pulmonary/Chest: He is in respiratory distress. He has wheezes. He has rales.  Course breath sounds with rales bilaterally  Abdominal: Soft. There is no tenderness. There is no rebound and no guarding.  Musculoskeletal: He exhibits edema. He exhibits no tenderness.  +1 pitting edema  Neurological: He is alert. No cranial nerve deficit.  He exhibits normal muscle tone. Coordination normal.  Moving all extremities  Skin: Skin is warm.    ED Course  Procedures (including critical care time) Labs Review Labs Reviewed  BASIC METABOLIC PANEL - Abnormal; Notable for the following:    Sodium 127 (*)    Chloride 94 (*)    Glucose, Bld 103 (*)    GFR calc non Af Amer 85 (*)    All other components within normal limits  CBC - Abnormal; Notable for the following:    WBC 11.9 (*)    RBC 4.05 (*)    Hemoglobin 12.8 (*)    HCT 36.8 (*)    All other components within normal limits  BRAIN NATRIURETIC PEPTIDE - Abnormal; Notable for the following:    B Natriuretic Peptide 105.1 (*)    All other components within normal limits  URINALYSIS, ROUTINE W REFLEX MICROSCOPIC - Abnormal; Notable for the following:    Color, Urine AMBER (*)    Protein, ur 100 (*)    Urobilinogen, UA 2.0 (*)    All other components within normal limits  BLOOD GAS, ARTERIAL - Abnormal; Notable for the following:    pO2, Arterial 76.9 (*)    All other components within normal limits  MAGNESIUM  PHOSPHORUS  TSH  URINE MICROSCOPIC-ADD ON  COMPREHENSIVE METABOLIC PANEL  CBC WITH DIFFERENTIAL/PLATELET  I-STAT TROPOININ, ED  I-STAT CG4 LACTIC ACID, ED  I-STAT CG4 LACTIC ACID, ED    Imaging Review Dg Chest 2 View (if Patient Has Fever And/or Copd)  10/18/2014   CLINICAL DATA:  hx dementia. Family reports pt has had increased weakness and confusion for the past 4 days. Family recently got over a cold. O2 sats 83% on RA. Put pt on 6L [place] which increased O2 sats to 90. Pt has audible rhonchi and coughed up blood tinged sputum with EMS. Family reports pt had similar symptoms 3 years ago and was diagnosed with a UTI. H/o alzheimer's, pneumonia. Family reports was on HTN medication, but recently was taken off of it. Former smoker  EXAM: CHEST - 2 VIEW  COMPARISON:  05/09/2012  FINDINGS: Coarse airspace opacities throughout the left lung, new since previous exam.  Coarse interstitial and patchy airspace opacities at the right lung base are largely stable. Attenuated right upper lobe bronchovascular markings. Mild cardiomegaly stable. No definite effusion.  Atheromatous aortic arch. Visualized skeletal structures are unremarkable.  IMPRESSION: 1. New extensive left lung airspace opacities suggesting pneumonia or asymmetric edema.   Electronically Signed   By: Corlis Leak  Hassell M.D.   On: 11/11/2014 13:57     EKG Interpretation   Date/Time:  Sunday October 30 2014 12:53:52 EST Ventricular Rate:  86 PR Interval:  164 QRS Duration: 86 QT Interval:  363 QTC Calculation: 434 R Axis:   -4 Text Interpretation:  Sinus rhythm Low voltage,  precordial leads  Anteroseptal infarct, old Baseline wander in lead(s) V4 No significant  change was found Confirmed by Manus Gunning  MD, Tulio Facundo [phone]) on 2014/11/06  1:17:41 PM      MDM   Final diagnoses:  CAP (community acquired pneumonia)   Worsening confusion, weakness from home with cough and hypoxia. Patient with mild respiratory distress and tachypnea to 30. New oxygen requirement.   CXR with bilateral infiltrates.  Suspect CAP but possible aspiration as well.  Less likely CHF.  Cover with broad spectrum antibiotics, cultures sent. Mild hyponatremia. No fever or tachycardia.   tachypneic with coarse breath sounds and new O2 requirement.  Daughter would be agreeable to short term intubation if needed.   D/w Dr. Waynard Edwards who will admit for Dr. Evlyn Kanner.   CRITICAL CARE Performed by: Glynn Octave Total critical care time: 35 Critical care time was exclusive of separately billable procedures and treating other patients. Critical care was necessary to treat or prevent imminent or life-threatening deterioration. Critical care was time spent personally by me on the following activities: development of treatment plan with patient and/or surrogate as well as nursing, discussions with consultants, evaluation of patient's  response to treatment, examination of patient, obtaining history from patient or surrogate, ordering and performing treatments and interventions, ordering and review of laboratory studies, ordering and review of radiographic studies, pulse oximetry and re-evaluation of patient's condition.     Glynn Octave, MD 06-Nov-2014 [phone]

## 2014-10-30 NOTE — ED Notes (Signed)
Tried calling respiratory 3x with no response.

## 2014-10-30 NOTE — ED Notes (Signed)
Have tried to contact RESP 2 times and no answer by phone. RN aware

## 2014-10-30 NOTE — H&P (Signed)
Corey Farmer is an 81 y.o. male.   Chief Complaint: sob  HPI: Corey Farmer is an 81 yo male with dementia.  He had a cough and was given some antibiotics in late December 2015. It did improve some. He then developed some dental issues and he has been amoxicillin for 3 weeks for dental issues. He had his tooth pulled 10 days ago. He does not aspirate obviously but does have some issues with liquids.  He has for 4 days looked worse.  He had poor urine output yesterday. He has had some Chills with no definite fevers and he has also become more weak and more disoriented.    The cough has been present and is not really worse his daughter says. Constantly clearing his nasal passages.  It is not really productive.  He was given advair from our office a while ago but has not been using this lately. He has two days of increased work of breathing. He came to ER today via EMS. He is hypoxic and has bilateral pneumonia on chest xray.  Past Medical History  Diagnosis Date  . Alzheimer disease   . Pneumonia   . Thyroid disease   . Hypertension   . Hyperlipidemia   . Dementia   Anemia Thoracentesis/pleural effusion Osteoarthritis bph Depression Gait disorder/foot drop gerd Abnormal liver function tests Elevated cpk Pulmonary fibrosis-noted since a past pneumonia Hyponatremia Urosepsis hypothyroidism  Past Surgical History  Procedure Laterality Date  . Hip surgery    . Lumbar laminectomy    Bilateral hernia Rt cataract Cervical disk   No family history on file. Social History:  reports that he has quit smoking. His smoking use included Cigarettes. He does not have any smokeless tobacco history on file. He reports that he drinks alcohol. He reports that he does not use illicit drugs.  Widower 2009. Lives with his daughter Butch Penny.  2 GC, no GGC.  Ex bank auditor. Former smoker.  Allergies:  Allergies  Allergen Reactions  . Sulfur Other (See Comments)    unknown  novocaine   Home  meds: aricept 10 mg qhs Synthroid 175 mcg daily Simvastatin 40 mg qhs effexor 75 mg daily Omeprazole 20 mg daily namenda 10 mg po bid Vit d 50000 unit once a week Doxazosin 2 mg daily    Results for orders placed or performed during the hospital encounter of 10/20/2014 (from the past 48 hour(s))  Basic metabolic panel    (if pt has PMH of COPD)     Status: Abnormal   Collection Time: 10/27/2014 12:56 PM  Result Value Ref Range   Sodium 127 (L) 135 - 145 mmol/L   Potassium 4.3 3.5 - 5.1 mmol/L   Chloride 94 (L) 96 - 112 mmol/L   CO2 26 19 - 32 mmol/L   Glucose, Bld 103 (H) 70 - 99 mg/dL   BUN 14 6 - 23 mg/dL   Creatinine, Ser 0.66 0.50 - 1.35 mg/dL   Calcium 9.0 8.4 - 10.5 mg/dL   GFR calc non Af Amer 85 (L) >90 mL/min   GFR calc Af Amer >90 >90 mL/min    Comment: (NOTE) The eGFR has been calculated using the CKD EPI equation. This calculation has not been validated in all clinical situations. eGFR's persistently <90 mL/min signify possible Chronic Kidney Disease.    Anion gap 7 5 - 15  CBC     (if pt has PMH of COPD)     Status: Abnormal   Collection Time: 11/05/2014  12:56 PM  Result Value Ref Range   WBC 11.9 (H) 4.0 - 10.5 K/uL   RBC 4.05 (L) 4.22 - 5.81 MIL/uL   Hemoglobin 12.8 (L) 13.0 - 17.0 g/dL   HCT 62.4 (L) 20.9 - 11.5 %   MCV 90.9 78.0 - 100.0 fL   MCH 31.6 26.0 - 34.0 pg   MCHC 34.8 30.0 - 36.0 g/dL   RDW 35.1 48.2 - 59.8 %   Platelets 264 150 - 400 K/uL  BNP (order ONLY if patient complains of dyspnea/SOB AND you have documented it for THIS visit)     Status: Abnormal   Collection Time: 11/05/2014 12:58 PM  Result Value Ref Range   B Natriuretic Peptide 105.1 (H) 0.0 - 100.0 pg/mL  I-stat troponin, ED (if patient has history of COPD)     Status: None   Collection Time: 10/17/2014  1:17 PM  Result Value Ref Range   Troponin i, poc 0.01 0.00 - 0.08 ng/mL   Comment 3            Comment: Due to the release kinetics of cTnI, a negative result within the first  hours of the onset of symptoms does not rule out myocardial infarction with certainty. If myocardial infarction is still suspected, repeat the test at appropriate intervals.   I-Stat CG4 Lactic Acid, ED     Status: None   Collection Time: 11/06/2014  1:23 PM  Result Value Ref Range   Lactic Acid, Venous 1.15 0.5 - 2.0 mmol/L   Dg Chest 2 View (if Patient Has Fever And/or Copd)  10/29/2014   CLINICAL DATA:  hx dementia. Family reports pt has had increased weakness and confusion for the past 4 days. Family recently got over a cold. O2 sats 83% on RA. Put pt on 6L [place] which increased O2 sats to 90. Pt has audible rhonchi and coughed up blood tinged sputum with EMS. Family reports pt had similar symptoms 3 years ago and was diagnosed with a UTI. H/o alzheimer's, pneumonia. Family reports was on HTN medication, but recently was taken off of it. Former smoker  EXAM: CHEST - 2 VIEW  COMPARISON:  05/09/2012  FINDINGS: Coarse airspace opacities throughout the left lung, new since previous exam. Coarse interstitial and patchy airspace opacities at the right lung base are largely stable. Attenuated right upper lobe bronchovascular markings. Mild cardiomegaly stable. No definite effusion.  Atheromatous aortic arch. Visualized skeletal structures are unremarkable.  IMPRESSION: 1. New extensive left lung airspace opacities suggesting pneumonia or asymmetric edema.   Electronically Signed   By: Corlis Leak M.D.   On: 10/28/2014 13:57    ROS:as per hpi.  Some loose stool yesterday. No blood noted from above or below.  Blood pressure 108/83, pulse 84, temperature 98.3 F (36.8 C), temperature source Oral, resp. rate 25, SpO2 92 %.  Age appropriate. Semi-supine.  Increased work of breathing.  He has wet rhonchi and rales at both bases. Mild wheeze noted.  No jvd. Heart is rrr no m/r/g. abd is soft, nt. Nd. No mass or hsm.  1-2 + bilat. Ankle edema. Moe  Times four. Cannot relate recent events. Oriented to person.   Place.  Assessment/Plan  81 yo male with dementia with CAP.  He is rather ill and he is hypoxic and has increased work of breathing. We will admit and treat with zosyn and vancomycin.   We will give oxygen and nebulizer treatment.   Given his wheeze and bronchospasm I will give  pulmicort nebs as well.  (it is unclear whether or not there truly is a degree of underlying pulmonary fibrosis). We will ask for speech therapy to assess swallowing. P.T. eval and treat.  Continue other home meds. He is a limited code blue status (intubation would be desired but no chest compressions or defibrillation).  We will admit to stepdown as he is seriously ill currently. Prognosis is gaurded.  Jerlyn Ly, MD 10/31/2014, 3:20 PM

## 2014-10-31 ENCOUNTER — Encounter (HOSPITAL_COMMUNITY): Payer: Self-pay

## 2014-10-31 ENCOUNTER — Inpatient Hospital Stay (HOSPITAL_COMMUNITY): Payer: Medicare Other

## 2014-10-31 LAB — CBC WITH DIFFERENTIAL/PLATELET
Basophils Absolute: 0 10*3/uL (ref 0.0–0.1)
Basophils Relative: 0 % (ref 0–1)
EOS ABS: 0.3 10*3/uL (ref 0.0–0.7)
EOS PCT: 3 % (ref 0–5)
HEMATOCRIT: 35.3 % — AB (ref 39.0–52.0)
Hemoglobin: 11.6 g/dL — ABNORMAL LOW (ref 13.0–17.0)
Lymphocytes Relative: 9 % — ABNORMAL LOW (ref 12–46)
Lymphs Abs: 0.9 10*3/uL (ref 0.7–4.0)
MCH: 30.3 pg (ref 26.0–34.0)
MCHC: 32.9 g/dL (ref 30.0–36.0)
MCV: 92.2 fL (ref 78.0–100.0)
MONO ABS: 1.4 10*3/uL — AB (ref 0.1–1.0)
MONOS PCT: 14 % — AB (ref 3–12)
Neutro Abs: 7.6 10*3/uL (ref 1.7–7.7)
Neutrophils Relative %: 74 % (ref 43–77)
PLATELETS: 250 10*3/uL (ref 150–400)
RBC: 3.83 MIL/uL — ABNORMAL LOW (ref 4.22–5.81)
RDW: 12.5 % (ref 11.5–15.5)
WBC: 10.1 10*3/uL (ref 4.0–10.5)

## 2014-10-31 LAB — COMPREHENSIVE METABOLIC PANEL
ALT: 16 U/L (ref 0–53)
AST: 22 U/L (ref 0–37)
Albumin: 3 g/dL — ABNORMAL LOW (ref 3.5–5.2)
Alkaline Phosphatase: 56 U/L (ref 39–117)
Anion gap: 7 (ref 5–15)
BILIRUBIN TOTAL: 1.2 mg/dL (ref 0.3–1.2)
BUN: 10 mg/dL (ref 6–23)
CALCIUM: 8.2 mg/dL — AB (ref 8.4–10.5)
CO2: 27 mmol/L (ref 19–32)
Chloride: 97 mmol/L (ref 96–112)
Creatinine, Ser: 0.57 mg/dL (ref 0.50–1.35)
GLUCOSE: 107 mg/dL — AB (ref 70–99)
Potassium: 4.1 mmol/L (ref 3.5–5.1)
Sodium: 131 mmol/L — ABNORMAL LOW (ref 135–145)
TOTAL PROTEIN: 5.9 g/dL — AB (ref 6.0–8.3)

## 2014-10-31 LAB — MRSA PCR SCREENING: MRSA BY PCR: NEGATIVE

## 2014-10-31 NOTE — Procedures (Signed)
Objective Swallowing Evaluation: Modified Barium Swallowing Study  Patient Details  Name: Corey Farmer MRN: [id number] Date of Birth: 12/07/1925  Today's Date: 10/31/2014 Time: SLP Start Time (ACUTE ONLY): 1340-SLP Stop Time (ACUTE ONLY): 1415 SLP Time Calculation (min) (ACUTE ONLY): 35 min  Past Medical History:  Past Medical History  Diagnosis Date  . Alzheimer disease   . Pneumonia   . Thyroid disease   . Hypertension   . Hyperlipidemia   . Dementia    Past Surgical History:  Past Surgical History  Procedure Laterality Date  . Hip surgery    . Lumbar laminectomy     HPI:  HPI: 81 yo male admitted with 2 day h/o increased WOB.  Pt was found to be hypoxic and bilateral pneumonia was noted on CXR.  Daughter reports pt has a good appetite, but he does cough intermittently during meals, and clears his throat frequently.  Daughter reports frequent choking when taking pills with water, and when drinking large "gulps" of coffee.  No Data Recorded  Assessment / Plan / Recommendation CHL IP CLINICAL IMPRESSIONS 10/31/2014  Dysphagia Diagnosis Mild pharyngeal phase dysphagia  Clinical impression Pt exhibits a mild pharyngeal dysphagia, with penetration of thin liquid when taking large consecutive swallows. There was some esophageal residue after the swallow, and the pill required extra swallows of liquid to clear.  Pt could have backflow of liquids into the pharynx after swallowing, however, this was not observed during the study.  While moving the pt back to the bed, the pt began coughing and his voice quality was wet.  Daughter reports pt does have a h/o reflux.      CHL IP TREATMENT RECOMMENDATION 10/31/2014  Treatment Plan Recommendations Therapy as outlined in treatment plan below     CHL IP DIET RECOMMENDATION 10/31/2014  Diet Recommendations Dysphagia 3 (Mechanical Soft);Thin liquid  Liquid Administration via Cup;Straw  Medication Administration Whole meds with puree   Compensations Slow rate;Small sips/bites  Postural Changes and/or Swallow Maneuvers Seated upright 90 degrees;Upright 30-60 min after meal     CHL IP OTHER RECOMMENDATIONS 10/31/2014  Recommended Consults MBS  Oral Care Recommendations Oral care BID  Other Recommendations Clarify dietary restrictions     CHL IP FOLLOW UP RECOMMENDATIONS 10/31/2014  Follow up Recommendations 24 hour supervision/assistance     CHL IP FREQUENCY AND DURATION 10/31/2014  Speech Therapy Frequency (ACUTE ONLY) min 1 x/week  Treatment Duration 1 week     Pertinent Vitals/Pain Low grade fever; CXR: Bilateral pneumonia    SLP Swallow Goals No flowsheet data found.  No flowsheet data found.    CHL IP REASON FOR REFERRAL 10/31/2014  Reason for Referral Objectively evaluate swallowing function     CHL IP ORAL PHASE 10/31/2014  Lips (None)  Tongue (None)  Mucous membranes (None)  Nutritional status (None)  Other (None)  Oxygen therapy (None)  Oral Phase WFL  Oral - Pudding Teaspoon (None)  Oral - Pudding Cup (None)  Oral - Honey Teaspoon (None)  Oral - Honey Cup (None)  Oral - Honey Syringe (None)  Oral - Nectar Teaspoon (None)  Oral - Nectar Cup (None)  Oral - Nectar Straw (None)  Oral - Nectar Syringe (None)  Oral - Ice Chips (None)  Oral - Thin Teaspoon (None)  Oral - Thin Cup (None)  Oral - Thin Straw (None)  Oral - Thin Syringe (None)  Oral - Puree (None)  Oral - Mechanical Soft (None)  Oral - Regular (None)  Oral - Multi-consistency (None)  Oral - Pill (None)  Oral Phase - Comment (None)      CHL IP PHARYNGEAL PHASE 10/31/2014  Pharyngeal Phase Impaired  Pharyngeal - Pudding Teaspoon (None)  Penetration/Aspiration details (pudding teaspoon) (None)  Pharyngeal - Pudding Cup (None)  Penetration/Aspiration details (pudding cup) (None)  Pharyngeal - Honey Teaspoon (None)  Penetration/Aspiration details (honey teaspoon) (None)  Pharyngeal - Honey Cup (None)  Penetration/Aspiration  details (honey cup) (None)  Pharyngeal - Honey Syringe (None)  Penetration/Aspiration details (honey syringe) (None)  Pharyngeal - Nectar Teaspoon (None)  Penetration/Aspiration details (nectar teaspoon) (None)  Pharyngeal - Nectar Cup (None)  Penetration/Aspiration details (nectar cup) (None)  Pharyngeal - Nectar Straw (None)  Penetration/Aspiration details (nectar straw) (None)  Pharyngeal - Nectar Syringe (None)  Penetration/Aspiration details (nectar syringe) (None)  Pharyngeal - Ice Chips (None)  Penetration/Aspiration details (ice chips) (None)  Pharyngeal - Thin Teaspoon (None)  Penetration/Aspiration details (thin teaspoon) (None)  Pharyngeal - Thin Cup (None)  Penetration/Aspiration details (thin cup) (None)  Pharyngeal - Thin Straw (None)  Penetration/Aspiration details (thin straw) (None)  Pharyngeal - Thin Syringe (None)  Penetration/Aspiration details (thin syringe') (None)  Pharyngeal - Puree (None)  Penetration/Aspiration details (puree) (None)  Pharyngeal - Mechanical Soft (None)  Penetration/Aspiration details (mechanical soft) (None)  Pharyngeal - Regular (None)  Penetration/Aspiration details (regular) (None)  Pharyngeal - Multi-consistency (None)  Penetration/Aspiration details (multi-consistency) (None)  Pharyngeal - Pill (None)  Penetration/Aspiration details (pill) (None)  Pharyngeal Comment (None)     CHL IP CERVICAL ESOPHAGEAL PHASE 10/31/2014  Cervical Esophageal Phase WFL  Pudding Teaspoon (None)  Pudding Cup (None)  Honey Teaspoon (None)  Honey Cup (None)  Honey Syringe (None)  Nectar Teaspoon (None)  Nectar Cup (None)  Nectar Straw (None)  Nectar Syringe (None)  Thin Teaspoon (None)  Thin Cup (None)  Thin Straw (None)  Thin Syringe (None)  Cervical Esophageal Comment (None)    No flowsheet data found.         Maryjo RochesterWillis, Tynasia Mccaul T 10/31/2014, 2:35 PM

## 2014-10-31 NOTE — Progress Notes (Signed)
CARE MANAGEMENT NOTE 10/31/2014  Patient:  Donna ChristenRAYLE,Keano S   Account Number:  [id number]  Date Initiated:  10/31/2014  Documentation initiated by:  DAVIS,RHONDA  Subjective/Objective Assessment:   pt with hx of pna with failed outpt treatment and increased wob,o2 on admit 82% pna confirmed by cxr     Action/Plan:   lives with adult children   Anticipated DC Date:  11-30-14   Anticipated DC Plan:  HOME/SELF CARE  In-house referral  NA         PAC Choice  NA   Choice offered to / List presented to:     DME arranged  NA           HH agency  NA   Status of service:  In process, will continue to follow Medicare Important Message given?   (If response is "NO", the following Medicare IM given date fields will be blank) Date Medicare IM given:   Medicare IM given by:   Date Additional Medicare IM given:   Additional Medicare IM given by:    Discharge Disposition:    Per UR Regulation:  Reviewed for med. necessity/level of care/duration of stay  If discussed at Long Length of Stay Meetings, dates discussed:    Comments:  Feb. 15 2016/Rhonda L. Earlene Plateravis, RN, BSN, CCM/Case Management [employer] [phone] No discharge needs present of time of review.

## 2014-10-31 NOTE — Progress Notes (Signed)
Subjective: Has defervesced overnight. Eating vigourously. Some cough and congestion. Denies pain   Objective: Vital signs in last 24 hours: Temp:  [98.2 F (36.8 C)-99.8 F (37.7 C)] 98.2 F (36.8 C) (02/15 0810) Pulse Rate:  [79-99] 94 (02/15 0600) Resp:  [15-38] 28 (02/15 0600) BP: (88-134)/(36-102) 122/59 mmHg (02/15 0600) SpO2:  [88 %-100 %] 93 % (02/15 0809) Weight:  [95.8 kg (211 lb 3.2 oz)-96.2 kg (212 lb 1.3 oz)] 96.2 kg (212 lb 1.3 oz) (02/15 0424)  Intake/Output from previous day: 02/14 0701 - 02/15 0700 In: 1050 [I.V.:700; IV Piggyback:350] Out: 700 [Urine:700] Intake/Output this shift:    Non toxic, sitting up. Eating without choking. Face symmetric, bilat a few wheezes and rhonchi. Ht regular sl fast. abd good BS's extrems 1+ edema, fungal nails, recognized me immediately speech hoarse but fluent,   Lab Results   Recent Labs  Feb 01, 2015 1256 10/31/14 0426  WBC 11.9* 10.1  RBC 4.05* 3.83*  HGB 12.8* 11.6*  HCT 36.8* 35.3*  MCV 90.9 92.2  MCH 31.6 30.3  RDW 12.4 12.5  PLT 264 250    Recent Labs  Feb 01, 2015 1256 10/31/14 0426  NA 127* 131*  K 4.3 4.1  CL 94* 97  CO2 26 27  GLUCOSE 103* 107*  BUN 14 10  CREATININE 0.66 0.57  CALCIUM 9.0 8.2*    Studies/Results: Dg Chest 2 View (if Patient Has Fever And/or Copd)  03-29-2015   CLINICAL DATA:  hx dementia. Family reports pt has had increased weakness and confusion for the past 4 days. Family recently got over a cold. O2 sats 83% on RA. Put pt on 6L [place] which increased O2 sats to 90. Pt has audible rhonchi and coughed up blood tinged sputum with EMS. Family reports pt had similar symptoms 3 years ago and was diagnosed with a UTI. H/o alzheimer's, pneumonia. Family reports was on HTN medication, but recently was taken off of it. Former smoker  EXAM: CHEST - 2 VIEW  COMPARISON:  05/09/2012  FINDINGS: Coarse airspace opacities throughout the left lung, new since previous exam. Coarse interstitial and patchy  airspace opacities at the right lung base are largely stable. Attenuated right upper lobe bronchovascular markings. Mild cardiomegaly stable. No definite effusion.  Atheromatous aortic arch. Visualized skeletal structures are unremarkable.  IMPRESSION: 1. New extensive left lung airspace opacities suggesting pneumonia or asymmetric edema.   Electronically Signed   By: Corlis Leak  Hassell M.D.   On: 007-13-2016 13:57    Scheduled Meds: . albuterol  2.5 mg Nebulization Q4H  . antiseptic oral rinse  7 mL Mouth Rinse q12n4p  . budesonide  0.5 mg Nebulization BID  . chlorhexidine  15 mL Mouth Rinse BID  . donepezil  10 mg Oral QHS  . enoxaparin (LOVENOX) injection  40 mg Subcutaneous Q24H  . levothyroxine  175 mcg Oral QAC breakfast  . memantine  10 mg Oral BID  . pantoprazole  40 mg Oral Daily  . piperacillin-tazobactam (ZOSYN)  IV  3.375 g Intravenous Q8H  . saccharomyces boulardii  500 mg Oral BID  . simvastatin  20 mg Oral QPM  . vancomycin  1,250 mg Intravenous Q12H  . venlafaxine XR  75 mg Oral Daily   Continuous Infusions: . sodium chloride 50 mL/hr at Feb 01, 2015 2300   PRN Meds:acetaminophen **OR** acetaminophen, ondansetron **OR** ondansetron (ZOFRAN) IV  Assessment/Plan: PNEUMONIA: Doing better already. BP improved. Sats OK, WBC down to 10. Check XR in AM MCI/DEMENTIA: at his baseline  HYPOTHYROID: on Rx  GAIT DISORDER: present for decades, has scissor gait and instability HYPONATREMIA: at his baseline HYPERLIPIDEMIA: on Rx LIMITED CODE   LOS: 1 day   Corey Farmer Corey Farmer 10/31/2014, 9:16 AM

## 2014-10-31 NOTE — Progress Notes (Signed)
PT Cancellation Note  Patient Details Name: Corey ChristenDonald S Farmer MRN: [id number] DOB: 01-03-1926   Cancelled Treatment:    Reason Eval/Treat Not Completed: Patient at procedure or test/unavailable (swallow eval, on Venti mask)will check back tomorrow.   Rada HayHill, [name] 10/31/2014, 3:21 PM [name] PT [phone]

## 2014-10-31 NOTE — Clinical Documentation Improvement (Signed)
ED note 01-07-2015 notes patient in respiratory distress, tachypneic with coarse breath sounds and new O2 requirement;  H&P notes pateint has had increased work of breathing x 2 days.  Pt initially placed on O2 6L via [place], then changed to non-rebreather at 15, currently on venturi mask at 14.  Please identify any additional clinical conditions associated with the patient's diagnosis of pneumonia and the above information, if any, and document in your progress notes and carry over to the discharge summary.   Possible Clinical Conditions: -Acute respiratory failure -Acute on chronic respiratory failure -Other condition (please specify) -Unable to determine at present  Thank you, Doy MinceVangela Barrett Goldie, RN [phone] Clinical Documentation Specialist

## 2014-10-31 NOTE — Evaluation (Signed)
Clinical/Bedside Swallow Evaluation Patient Details  Name: Corey Farmer MRN: [id number] Date of Birth: [birth year]  Today's Date: 10/31/2014 Time: SLP Start Time (ACUTE ONLY): 1040 SLP Stop Time (ACUTE ONLY): 1120 SLP Time Calculation (min) (ACUTE ONLY): 40 min  Past Medical History:  Past Medical History  Diagnosis Date  . Alzheimer disease   . Pneumonia   . Thyroid disease   . Hypertension   . Hyperlipidemia   . Dementia    Past Surgical History:  Past Surgical History  Procedure Laterality Date  . Hip surgery    . Lumbar laminectomy     HPI:  81 yo male admitted with 2 day h/o increased WOB.  Pt was found to be hypoxic and bilateral pneumonia was noted on CXR.  Daughter reports pt has a good appetite, but he does cough intermittently during meals, and clears his throat frequently.  Daughter reports frequent choking when taking pills with water, and when drinking large "gulps" of coffee.   Assessment / Plan / Recommendation Clinical Impression  Pt presents with s/s of aspriation, including audible throat clearing and dealyed cough response after swallows of thin and nectar thick liqudis.  Daughter reports frequent choking episodes with water when taking pills, and when drinking large swallows of coffee.  Further objective evaluation is indicated to determine what food/liquid consistencies would be tolerated best.  Pt and daughter agree.  MBS scheduled for today at 1300.    Aspiration Risk  Moderate    Diet Recommendation NPO        Other  Recommendations Recommended Consults: MBS   Follow Up Recommendations       Frequency and Duration        Pertinent Vitals/Pain n/a     Swallow Study Prior Functional Status       General HPI: 81 yo male admitted with 2 day h/o increased WOB.  Pt was found to be hypoxic and bilateral pneumonia was noted on CXR.  Daughter reports pt has a good appetite, but he does cough intermittently during meals, and clears his throat  frequently.  Daughter reports frequent choking when taking pills with water, and when drinking large "gulps" of coffee. Type of Study: Bedside swallow evaluation Previous Swallow Assessment: none Diet Prior to this Study: Dysphagia 3 (soft);Thin liquids Temperature Spikes Noted: Yes Respiratory Status: non-rebreather History of Recent Intubation: No Behavior/Cognition: Alert;Cooperative;Pleasant mood;Requires cueing;Decreased sustained attention Oral Cavity - Dentition: Dentures, not available;Poor condition (recent abcess of 2 teeth per dtr.) Self-Feeding Abilities: Able to feed self;Needs set up Patient Positioning: Upright in bed Baseline Vocal Quality: Hoarse;Low vocal intensity Volitional Cough: Congested Volitional Swallow: Unable to elicit    Oral/Motor/Sensory Function Overall Oral Motor/Sensory Function: Appears within functional limits for tasks assessed   Ice Chips Ice chips: Within functional limits Presentation: Spoon   Thin Liquid Thin Liquid: Impaired Presentation: Spoon;Cup;Straw Pharyngeal  Phase Impairments: Suspected delayed Swallow;Decreased hyoid-laryngeal movement;Throat Clearing - Immediate;Cough - Delayed    Nectar Thick Nectar Thick Liquid: Impaired Presentation: Cup Pharyngeal Phase Impairments: Throat Clearing - Immediate   Honey Thick Honey Thick Liquid: Not tested   Puree Puree: Within functional limits Presentation: Spoon   Solid   GO    Solid: Within functional limits Pharyngeal Phase Impairments: Suspected delayed Swallow;Decreased hyoid-laryngeal movement;Throat Clearing - Immediate;Cough - Delayed       Corey Farmer, Corey Farmer 10/31/2014,11:21 AM

## 2014-11-01 ENCOUNTER — Inpatient Hospital Stay (HOSPITAL_COMMUNITY): Payer: Medicare Other

## 2014-11-01 DIAGNOSIS — J159 Unspecified bacterial pneumonia: Principal | ICD-10-CM

## 2014-11-01 LAB — COMPREHENSIVE METABOLIC PANEL
ALT: 19 U/L (ref 0–53)
AST: 23 U/L (ref 0–37)
Albumin: 2.9 g/dL — ABNORMAL LOW (ref 3.5–5.2)
Alkaline Phosphatase: 60 U/L (ref 39–117)
Anion gap: 7 (ref 5–15)
BILIRUBIN TOTAL: 1.3 mg/dL — AB (ref 0.3–1.2)
BUN: 9 mg/dL (ref 6–23)
CALCIUM: 8.4 mg/dL (ref 8.4–10.5)
CO2: 27 mmol/L (ref 19–32)
CREATININE: 0.53 mg/dL (ref 0.50–1.35)
Chloride: 95 mmol/L — ABNORMAL LOW (ref 96–112)
Glucose, Bld: 119 mg/dL — ABNORMAL HIGH (ref 70–99)
Potassium: 4.3 mmol/L (ref 3.5–5.1)
Sodium: 129 mmol/L — ABNORMAL LOW (ref 135–145)
TOTAL PROTEIN: 6 g/dL (ref 6.0–8.3)

## 2014-11-01 LAB — CBC WITH DIFFERENTIAL/PLATELET
Basophils Absolute: 0 10*3/uL (ref 0.0–0.1)
Basophils Relative: 0 % (ref 0–1)
Eosinophils Absolute: 0.1 10*3/uL (ref 0.0–0.7)
Eosinophils Relative: 1 % (ref 0–5)
HCT: 35.2 % — ABNORMAL LOW (ref 39.0–52.0)
Hemoglobin: 11.7 g/dL — ABNORMAL LOW (ref 13.0–17.0)
Lymphocytes Relative: 9 % — ABNORMAL LOW (ref 12–46)
Lymphs Abs: 1 10*3/uL (ref 0.7–4.0)
MCH: 31 pg (ref 26.0–34.0)
MCHC: 33.2 g/dL (ref 30.0–36.0)
MCV: 93.4 fL (ref 78.0–100.0)
Monocytes Absolute: 1.2 10*3/uL — ABNORMAL HIGH (ref 0.1–1.0)
Monocytes Relative: 10 % (ref 3–12)
Neutro Abs: 8.9 10*3/uL — ABNORMAL HIGH (ref 1.7–7.7)
Neutrophils Relative %: 80 % — ABNORMAL HIGH (ref 43–77)
PLATELETS: 245 10*3/uL (ref 150–400)
RBC: 3.77 MIL/uL — ABNORMAL LOW (ref 4.22–5.81)
RDW: 12.4 % (ref 11.5–15.5)
WBC: 11.2 10*3/uL — AB (ref 4.0–10.5)

## 2014-11-01 MED ORDER — FUROSEMIDE 10 MG/ML IJ SOLN
20.0000 mg | Freq: Once | INTRAMUSCULAR | Status: AC
  Administered 2014-11-01: 20 mg via INTRAVENOUS
  Filled 2014-11-01: qty 2

## 2014-11-01 NOTE — Progress Notes (Signed)
Subjective: Some productive cough. Asking to go home. Denies pain. Eating breakfast   Objective: Vital signs in last 24 hours: Temp:  [97.6 F (36.4 C)-99.4 F (37.4 C)] 99.3 F (37.4 C) (02/16 0800) Pulse Rate:  [59-101] 94 (02/16 0604) Resp:  [17-35] 29 (02/16 0604) BP: (99-146)/(50-80) 139/65 mmHg (02/16 0400) SpO2:  [90 %-100 %] 93 % (02/16 0604) FiO2 (%):  [55 %] 55 % (02/16 0604) Weight:  [97.1 kg (214 lb 1.1 oz)] 97.1 kg (214 lb 1.1 oz) (02/16 0454)  Intake/Output from previous day: 02/15 0701 - 02/16 0700 In: 2122.5 [P.O.:360; I.V.:1150; IV Piggyback:612.5] Out: 700 [Urine:700] Intake/Output this shift:    A bit more dyspnea. Arcus senilis. Lungs more dense left rhonchi, ht regular abd soft NT. extrems 1+ edema, awake, recognizes me  Lab Results   Recent Labs  10/31/14 0426 11/01/14 0355  WBC 10.1 11.2*  RBC 3.83* 3.77*  HGB 11.6* 11.7*  HCT 35.3* 35.2*  MCV 92.2 93.4  MCH 30.3 31.0  RDW 12.5 12.4  PLT 250 245    Recent Labs  10/31/14 0426 11/01/14 0355  NA 131* 129*  K 4.1 4.3  CL 97 95*  CO2 27 27  GLUCOSE 107* 119*  BUN 10 9  CREATININE 0.57 0.53  CALCIUM 8.2* 8.4    Studies/Results: Dg Chest 2 View (if Patient Has Fever And/or Copd)  August 24, 2015   CLINICAL DATA:  hx dementia. Family reports pt has had increased weakness and confusion for the past 4 days. Family recently got over a cold. O2 sats 83% on RA. Put pt on 6L [place] which increased O2 sats to 90. Pt has audible rhonchi and coughed up blood tinged sputum with EMS. Family reports pt had similar symptoms 3 years ago and was diagnosed with a UTI. H/o alzheimer's, pneumonia. Family reports was on HTN medication, but recently was taken off of it. Former smoker  EXAM: CHEST - 2 VIEW  COMPARISON:  05/09/2012  FINDINGS: Coarse airspace opacities throughout the left lung, new since previous exam. Coarse interstitial and patchy airspace opacities at the right lung base are largely stable. Attenuated right  upper lobe bronchovascular markings. Mild cardiomegaly stable. No definite effusion.  Atheromatous aortic arch. Visualized skeletal structures are unremarkable.  IMPRESSION: 1. New extensive left lung airspace opacities suggesting pneumonia or asymmetric edema.   Electronically Signed   By: Corlis Leak  Hassell M.D.   On: 0December 08, 2016 13:57   Dg Swallowing Func-speech Pathology  10/31/2014   Lenor DerrickLori T Willis, CCC-SLP     10/31/2014  2:36 PM  Objective Swallowing Evaluation: Modified Barium Swallowing Study   Patient Details  Name: Corey ChristenDonald S Farmer MRN: [id number] Date of Birth: [birth year]  Today's Date: 10/31/2014 Time: SLP Start Time (ACUTE ONLY): 1340-SLP Stop Time (ACUTE  ONLY): 1415 SLP Time Calculation (min) (ACUTE ONLY): 35 min  Past Medical History:  Past Medical History  Diagnosis Date  . Alzheimer disease   . Pneumonia   . Thyroid disease   . Hypertension   . Hyperlipidemia   . Dementia    Past Surgical History:  Past Surgical History  Procedure Laterality Date  . Hip surgery    . Lumbar laminectomy     HPI:  HPI: 81 yo male admitted with 2 day h/o increased WOB.  Pt was  found to be hypoxic and bilateral pneumonia was noted on CXR.   Daughter reports pt has a good appetite, but he does cough  intermittently during meals, and clears his throat frequently.  Daughter reports frequent choking when taking pills with water,  and when drinking large "gulps" of coffee.  No Data Recorded  Assessment / Plan / Recommendation CHL IP CLINICAL IMPRESSIONS 10/31/2014  Dysphagia Diagnosis Mild pharyngeal phase dysphagia  Clinical impression Pt exhibits a mild pharyngeal dysphagia, with  penetration of thin liquid when taking large consecutive  swallows. There was some esophageal residue after the swallow,  and the pill required extra swallows of liquid to clear.  Pt  could have backflow of liquids into the pharynx after swallowing,  however, this was not observed during the study.  While moving  the pt back to the bed, the pt began  coughing and his voice  quality was wet.  Daughter reports pt does have a h/o reflux.      CHL IP TREATMENT RECOMMENDATION 10/31/2014  Treatment Plan Recommendations Therapy as outlined in treatment  plan below     CHL IP DIET RECOMMENDATION 10/31/2014  Diet Recommendations Dysphagia 3 (Mechanical Soft);Thin liquid  Liquid Administration via Cup;Straw  Medication Administration Whole meds with puree  Compensations Slow rate;Small sips/bites  Postural Changes and/or Swallow Maneuvers Seated upright 90  degrees;Upright 30-60 min after meal     CHL IP OTHER RECOMMENDATIONS 10/31/2014  Recommended Consults MBS  Oral Care Recommendations Oral care BID  Other Recommendations Clarify dietary restrictions     CHL IP FOLLOW UP RECOMMENDATIONS 10/31/2014  Follow up Recommendations 24 hour supervision/assistance     CHL IP FREQUENCY AND DURATION 10/31/2014  Speech Therapy Frequency (ACUTE ONLY) min 1 x/week  Treatment Duration 1 week     Pertinent Vitals/Pain Low grade fever; CXR: Bilateral pneumonia    SLP Swallow Goals No flowsheet data found.  No flowsheet data found.    CHL IP REASON FOR REFERRAL 10/31/2014  Reason for Referral Objectively evaluate swallowing function     CHL IP ORAL PHASE 10/31/2014  Lips (None)  Tongue (None)  Mucous membranes (None)  Nutritional status (None)  Other (None)  Oxygen therapy (None)  Oral Phase WFL  Oral - Pudding Teaspoon (None)  Oral - Pudding Cup (None)  Oral - Honey Teaspoon (None)  Oral - Honey Cup (None)  Oral - Honey Syringe (None)  Oral - Nectar Teaspoon (None)  Oral - Nectar Cup (None)  Oral - Nectar Straw (None)  Oral - Nectar Syringe (None)  Oral - Ice Chips (None)  Oral - Thin Teaspoon (None)  Oral - Thin Cup (None)  Oral - Thin Straw (None)  Oral - Thin Syringe (None)  Oral - Puree (None)  Oral - Mechanical Soft (None)  Oral - Regular (None)  Oral - Multi-consistency (None)  Oral - Pill (None)  Oral Phase - Comment (None)      CHL IP PHARYNGEAL PHASE 10/31/2014  Pharyngeal Phase  Impaired  Pharyngeal - Pudding Teaspoon (None)  Penetration/Aspiration details (pudding teaspoon) (None)  Pharyngeal - Pudding Cup (None)  Penetration/Aspiration details (pudding cup) (None)  Pharyngeal - Honey Teaspoon (None)  Penetration/Aspiration details (honey teaspoon) (None)  Pharyngeal - Honey Cup (None)  Penetration/Aspiration details (honey cup) (None)  Pharyngeal - Honey Syringe (None)  Penetration/Aspiration details (honey syringe) (None)  Pharyngeal - Nectar Teaspoon (None)  Penetration/Aspiration details (nectar teaspoon) (None)  Pharyngeal - Nectar Cup (None)  Penetration/Aspiration details (nectar cup) (None)  Pharyngeal - Nectar Straw (None)  Penetration/Aspiration details (nectar straw) (None)  Pharyngeal - Nectar Syringe (None)  Penetration/Aspiration details (nectar syringe) (None)  Pharyngeal - Ice Chips (None)  Penetration/Aspiration details (ice chips) (None)  Pharyngeal - Thin Teaspoon (None)  Penetration/Aspiration details (thin teaspoon) (None)  Pharyngeal - Thin Cup (None)  Penetration/Aspiration details (thin cup) (None)  Pharyngeal - Thin Straw (None)  Penetration/Aspiration details (thin straw) (None)  Pharyngeal - Thin Syringe (None)  Penetration/Aspiration details (thin syringe') (None)  Pharyngeal - Puree (None)  Penetration/Aspiration details (puree) (None)  Pharyngeal - Mechanical Soft (None)  Penetration/Aspiration details (mechanical soft) (None)  Pharyngeal - Regular (None)  Penetration/Aspiration details (regular) (None)  Pharyngeal - Multi-consistency (None)  Penetration/Aspiration details (multi-consistency) (None)  Pharyngeal - Pill (None)  Penetration/Aspiration details (pill) (None)  Pharyngeal Comment (None)     CHL IP CERVICAL ESOPHAGEAL PHASE 10/31/2014  Cervical Esophageal Phase WFL  Pudding Teaspoon (None)  Pudding Cup (None)  Honey Teaspoon (None)  Honey Cup (None)  Honey Syringe (None)  Nectar Teaspoon (None)  Nectar Cup (None)  Nectar Straw (None)  Nectar Syringe  (None)  Thin Teaspoon (None)  Thin Cup (None)  Thin Straw (None)  Thin Syringe (None)  Cervical Esophageal Comment (None)    No flowsheet data found.         Maryjo Rochester T 10/31/2014, 2:35 PM     Scheduled Meds: . albuterol  2.5 mg Nebulization Q4H  . antiseptic oral rinse  7 mL Mouth Rinse q12n4p  . budesonide  0.5 mg Nebulization BID  . chlorhexidine  15 mL Mouth Rinse BID  . donepezil  10 mg Oral QHS  . enoxaparin (LOVENOX) injection  40 mg Subcutaneous Q24H  . levothyroxine  175 mcg Oral QAC breakfast  . memantine  10 mg Oral BID  . pantoprazole  40 mg Oral Daily  . piperacillin-tazobactam (ZOSYN)  IV  3.375 g Intravenous Q8H  . saccharomyces boulardii  500 mg Oral BID  . simvastatin  20 mg Oral QPM  . vancomycin  1,250 mg Intravenous Q12H  . venlafaxine XR  75 mg Oral Daily   Continuous Infusions: . sodium chloride 50 mL/hr at 11/01/14 0119   PRN Meds:acetaminophen **OR** acetaminophen, ondansetron **OR** ondansetron (ZOFRAN) IV  Assessment/Plan:  PNEUMONIA: Doing better already. BP stable. Sats OK, WBC fair at 11. Check XR not completed MCI/DEMENTIA: at his baseline  HYPOTHYROID: on Rx GAIT DISORDER: present for decades, has scissor gait and instability HYPONATREMIA: at his baseline, stays a bit under 130 HYPERLIPIDEMIA: on Rx SWALLOWING ISSUES: to have MBSS LIMITED CODE   LOS: 2 days   Arabella Revelle ALAN 11/01/2014, 8:11 AM

## 2014-11-01 NOTE — Consult Note (Signed)
Name: Corey Farmer MRN: [id number] DOB: 06-Jul-1926    ADMISSION DATE:  2014-09-18 CONSULTATION DATE:  11/01/14  REFERRING MD :  Dr. Evlyn KannerSouth   CHIEF COMPLAINT:  Dyspnea   BRIEF PATIENT DESCRIPTION: 81 y/o M with PMH of dementia admitted 2/14 with shortness of breath.  Work up consistent with bilateral PNA.  Concern for aspiration.  PCCM consulted for evaluation.   SIGNIFICANT EVENTS  2/14  Admit with cough, SOB  2/16  PCCM consulted for hypoxic respiratory failure, concern for aspiration PNA  STUDIES:  2/15  Failed bedside swallow evaluation with SLP.  NPO & MBS recommended  2/15  MBS >> mild pharyngeal dysphagia.  D3 diet (mechanical soft) with thin liquids, meds whole with puree   HISTORY OF PRESENT ILLNESS:  81 y/o M with PMH hypothyroidism, HTN, HLD, and dementia who [facility] with complaints of shortness of breath.  The family had noted ongoing cough with swallowing and taking pills for "quite some time".  He was treated for a cough in December with antibiotics by his primary MD.  His daughter reports he was on a second round of antibiotics (amoxicillin) for a dental abscess and had a couple of teeth pulled around 2/4.  Since that time, she has noted him to mildly confused with progressive weakness. He then developed a more moist cough and increased work of breathing.  She activated EMS for transport.    At baseline, he lives in an apartment in her home with part time care assistance.  He is usually ambulatory within the home with lift shoes. He does not cook for himself and has required more assistance with dressing since December due to weakness.    ER evaluation noted the patient to have audible gurgling & tachypnea on presentation with room air saturations of 83%.  The patient was placed on 6L with improvement to 90%.  CXR was assessed and showed bilateral patchy infiltrates.  He was admitted per Dr. Waynard EdwardsPerini for Dr. Evlyn KannerSouth (PCP) and treated empirically for CAP.  He  developed worsening hypoxemia and required 100% NRB.  SLP evaluated for dysphagia and he was found to have mild pharyngeal dysphagia.  PCCM consulted for evaluation.   PAST MEDICAL HISTORY :   has a past medical history of Alzheimer disease; Pneumonia; Thyroid disease; Hypertension; Hyperlipidemia; and Dementia.  has past surgical history that includes Hip surgery and Lumbar laminectomy.   HOME MEDICATIONS:   Prior to Admission medications   Medication Sig Start Date End Date Taking? Authorizing Provider  Ascorbic Acid (VITAMIN C PO) Take 1 tablet by mouth daily.   Yes Historical Provider, MD  Cholecalciferol (VITAMIN D PO) Take 2 tablets by mouth at bedtime.   Yes Historical Provider, MD  donepezil (ARICEPT) 10 MG tablet Take 10 mg by mouth at bedtime as needed.   Yes Historical Provider, MD  doxazosin (CARDURA) 4 MG tablet Take 2 mg by mouth daily.   Yes Historical Provider, MD  Fluticasone-Salmeterol (ADVAIR DISKUS IN) Inhale 1 puff into the lungs daily as needed (cough.).   Yes Historical Provider, MD  levothyroxine (SYNTHROID, LEVOTHROID) 175 MCG tablet Take 175 mcg by mouth daily before breakfast.   Yes Historical Provider, MD  loratadine (CLARITIN) 10 MG tablet Take 10 mg by mouth daily.   Yes Historical Provider, MD  memantine (NAMENDA) 10 MG tablet Take 10 mg by mouth 2 (two) times daily.    Yes Historical Provider, MD  omeprazole (PRILOSEC OTC) 20 MG tablet Take  20 mg by mouth daily.   Yes Historical Provider, MD  simvastatin (ZOCOR) 40 MG tablet Take 40 mg by mouth every evening.   Yes Historical Provider, MD  venlafaxine XR (EFFEXOR-XR) 75 MG 24 hr capsule Take 75 mg by mouth daily.   Yes Historical Provider, MD   Allergies  Allergen Reactions  . Sulfur Other (See Comments)    unknown    FAMILY HISTORY:  family history is not on file.   SOCIAL HISTORY:  reports that he has quit smoking. His smoking use included Cigarettes. He has never used smokeless tobacco. He reports  that he drinks alcohol. He reports that he does not use illicit drugs.  REVIEW OF SYSTEMS:   Unable to complete with patient due to confusion.  Information obtained from daughter at bedside, Dr. Evlyn Kanner and previous medical documentation.    SUBJECTIVE:   VITAL SIGNS: Temp:  [97.6 F (36.4 C)-99.4 F (37.4 C)] 98.5 F (36.9 C) (02/16 1200) Pulse Rate:  [59-101] 94 (02/16 0944) Resp:  [20-35] 33 (02/16 0944) BP: (99-146)/(57-79) 140/66 mmHg (02/16 0944) SpO2:  [90 %-100 %] 95 % (02/16 0944) FiO2 (%):  [55 %] 55 % (02/16 0604) Weight:  [214 lb 1.1 oz (97.1 kg)] 214 lb 1.1 oz (97.1 kg) (02/16 0454)  PHYSICAL EXAMINATION: General:  Frail elderly male on NRB Neuro:  Awakens, disoriented but pleasant, MAE but generalized weakness  HEENT:  NCAT, mm pink/moist Cardiovascular:  s1s2 rrr, no m/r/g Lungs:  Tachypnea (28-30), lungs bilaterally with coarse rhonchi, mild accessory muscle use Abdomen:  Obese, soft, BSx4 active  Musculoskeletal:  No acute deformities  Skin:  Warm/dry, 1-2+ BLE edema     Recent Labs Lab Nov 23, 2014 1256 10/31/14 0426 11/01/14 0355  NA 127* 131* 129*  K 4.3 4.1 4.3  CL 94* 97 95*  CO2 26 27 27   BUN 14 10 9   CREATININE 0.66 0.57 0.53  GLUCOSE 103* 107* 119*    Recent Labs Lab November 23, 2014 1256 10/31/14 0426 11/01/14 0355  HGB 12.8* 11.6* 11.7*  HCT 36.8* 35.3* 35.2*  WBC 11.9* 10.1 11.2*  PLT 264 250 245   Dg Chest 2 View  11/01/2014   CLINICAL DATA:  Pneumonia.  Shortness of breath and coughing  EXAM: CHEST  2 VIEW  COMPARISON:  11-23-14.  05/09/2012.  FINDINGS: Mediastinum stable. Heart size stable. Diffuse severe left lung pulmonary infiltrate again noted. Progressive severe right lower lobe infiltrate present. These findings are consistent with severe bilateral pneumonia. Pulmonary edema cannot be excluded. Small bilateral pleural effusions cannot be excluded.  IMPRESSION: 1. Persistent diffuse severe left lung infiltrate. Progressive severe right  lower lobe infiltrate. Findings are most consistent bilateral pneumonia. Pulmonary edema cannot be excluded. 2. Small pleural effusions. 3. Stable cardiomegaly .   Electronically Signed   By: Maisie Fus  Register   On: 11/01/2014 09:44   Dg Chest 2 View (if Patient Has Fever And/or Copd)  November 23, 2014   CLINICAL DATA:  hx dementia. Family reports pt has had increased weakness and confusion for the past 4 days. Family recently got over a cold. O2 sats 83% on RA. Put pt on 6L [place] which increased O2 sats to 90. Pt has audible rhonchi and coughed up blood tinged sputum with EMS. Family reports pt had similar symptoms 3 years ago and was diagnosed with a UTI. H/o alzheimer's, pneumonia. Family reports was on HTN medication, but recently was taken off of it. Former smoker  EXAM: CHEST - 2 VIEW  COMPARISON:  05/09/2012  FINDINGS: Coarse airspace opacities throughout the left lung, new since previous exam. Coarse interstitial and patchy airspace opacities at the right lung base are largely stable. Attenuated right upper lobe bronchovascular markings. Mild cardiomegaly stable. No definite effusion.  Atheromatous aortic arch. Visualized skeletal structures are unremarkable.  IMPRESSION: 1. New extensive left lung airspace opacities suggesting pneumonia or asymmetric edema.   Electronically Signed   By: Corlis Leak M.D.   On: 11/01/2014 13:57   Dg Swallowing Func-speech Pathology  10/31/2014   Lenor Derrick, CCC-SLP     10/31/2014  2:36 PM  Objective Swallowing Evaluation: Modified Barium Swallowing Study   Patient Details  Name: Corey Farmer MRN: [id number] Date of Birth: 1926/02/03  Today's Date: 10/31/2014 Time: SLP Start Time (ACUTE ONLY): 1340-SLP Stop Time (ACUTE  ONLY): 1415 SLP Time Calculation (min) (ACUTE ONLY): 35 min  Past Medical History:  Past Medical History  Diagnosis Date  . Alzheimer disease   . Pneumonia   . Thyroid disease   . Hypertension   . Hyperlipidemia   . Dementia    Past Surgical History:  Past Surgical  History  Procedure Laterality Date  . Hip surgery    . Lumbar laminectomy     HPI:  HPI: 81 yo male admitted with 2 day h/o increased WOB.  Pt was  found to be hypoxic and bilateral pneumonia was noted on CXR.   Daughter reports pt has a good appetite, but he does cough  intermittently during meals, and clears his throat frequently.   Daughter reports frequent choking when taking pills with water,  and when drinking large "gulps" of coffee.  No Data Recorded  Assessment / Plan / Recommendation CHL IP CLINICAL IMPRESSIONS 10/31/2014  Dysphagia Diagnosis Mild pharyngeal phase dysphagia  Clinical impression Pt exhibits a mild pharyngeal dysphagia, with  penetration of thin liquid when taking large consecutive  swallows. There was some esophageal residue after the swallow,  and the pill required extra swallows of liquid to clear.  Pt  could have backflow of liquids into the pharynx after swallowing,  however, this was not observed during the study.  While moving  the pt back to the bed, the pt began coughing and his voice  quality was wet.  Daughter reports pt does have a h/o reflux.      CHL IP TREATMENT RECOMMENDATION 10/31/2014  Treatment Plan Recommendations Therapy as outlined in treatment  plan below     CHL IP DIET RECOMMENDATION 10/31/2014  Diet Recommendations Dysphagia 3 (Mechanical Soft);Thin liquid  Liquid Administration via Cup;Straw  Medication Administration Whole meds with puree  Compensations Slow rate;Small sips/bites  Postural Changes and/or Swallow Maneuvers Seated upright 90  degrees;Upright 30-60 min after meal     CHL IP OTHER RECOMMENDATIONS 10/31/2014  Recommended Consults MBS  Oral Care Recommendations Oral care BID  Other Recommendations Clarify dietary restrictions     CHL IP FOLLOW UP RECOMMENDATIONS 10/31/2014  Follow up Recommendations 24 hour supervision/assistance     CHL IP FREQUENCY AND DURATION 10/31/2014  Speech Therapy Frequency (ACUTE ONLY) min 1 x/week  Treatment Duration 1 week      Pertinent Vitals/Pain Low grade fever; CXR: Bilateral pneumonia    SLP Swallow Goals No flowsheet data found.  No flowsheet data found.    CHL IP REASON FOR REFERRAL 10/31/2014  Reason for Referral Objectively evaluate swallowing function     CHL IP ORAL PHASE 10/31/2014  Lips (None)  Tongue (None)  Mucous membranes (None)  Nutritional status (  None)  Other (None)  Oxygen therapy (None)  Oral Phase WFL  Oral - Pudding Teaspoon (None)  Oral - Pudding Cup (None)  Oral - Honey Teaspoon (None)  Oral - Honey Cup (None)  Oral - Honey Syringe (None)  Oral - Nectar Teaspoon (None)  Oral - Nectar Cup (None)  Oral - Nectar Straw (None)  Oral - Nectar Syringe (None)  Oral - Ice Chips (None)  Oral - Thin Teaspoon (None)  Oral - Thin Cup (None)  Oral - Thin Straw (None)  Oral - Thin Syringe (None)  Oral - Puree (None)  Oral - Mechanical Soft (None)  Oral - Regular (None)  Oral - Multi-consistency (None)  Oral - Pill (None)  Oral Phase - Comment (None)      CHL IP PHARYNGEAL PHASE 10/31/2014  Pharyngeal Phase Impaired  Pharyngeal - Pudding Teaspoon (None)  Penetration/Aspiration details (pudding teaspoon) (None)  Pharyngeal - Pudding Cup (None)  Penetration/Aspiration details (pudding cup) (None)  Pharyngeal - Honey Teaspoon (None)  Penetration/Aspiration details (honey teaspoon) (None)  Pharyngeal - Honey Cup (None)  Penetration/Aspiration details (honey cup) (None)  Pharyngeal - Honey Syringe (None)  Penetration/Aspiration details (honey syringe) (None)  Pharyngeal - Nectar Teaspoon (None)  Penetration/Aspiration details (nectar teaspoon) (None)  Pharyngeal - Nectar Cup (None)  Penetration/Aspiration details (nectar cup) (None)  Pharyngeal - Nectar Straw (None)  Penetration/Aspiration details (nectar straw) (None)  Pharyngeal - Nectar Syringe (None)  Penetration/Aspiration details (nectar syringe) (None)  Pharyngeal - Ice Chips (None)  Penetration/Aspiration details (ice chips) (None)  Pharyngeal - Thin Teaspoon (None)   Penetration/Aspiration details (thin teaspoon) (None)  Pharyngeal - Thin Cup (None)  Penetration/Aspiration details (thin cup) (None)  Pharyngeal - Thin Straw (None)  Penetration/Aspiration details (thin straw) (None)  Pharyngeal - Thin Syringe (None)  Penetration/Aspiration details (thin syringe') (None)  Pharyngeal - Puree (None)  Penetration/Aspiration details (puree) (None)  Pharyngeal - Mechanical Soft (None)  Penetration/Aspiration details (mechanical soft) (None)  Pharyngeal - Regular (None)  Penetration/Aspiration details (regular) (None)  Pharyngeal - Multi-consistency (None)  Penetration/Aspiration details (multi-consistency) (None)  Pharyngeal - Pill (None)  Penetration/Aspiration details (pill) (None)  Pharyngeal Comment (None)     CHL IP CERVICAL ESOPHAGEAL PHASE 10/31/2014  Cervical Esophageal Phase WFL  Pudding Teaspoon (None)  Pudding Cup (None)  Honey Teaspoon (None)  Honey Cup (None)  Honey Syringe (None)  Nectar Teaspoon (None)  Nectar Cup (None)  Nectar Straw (None)  Nectar Syringe (None)  Thin Teaspoon (None)  Thin Cup (None)  Thin Straw (None)  Thin Syringe (None)  Cervical Esophageal Comment (None)    No flowsheet data found.         Maryjo Rochester T 10/31/2014, 2:35 PM     ASSESSMENT / PLAN:  Bilateral Pulmonary Infiltrates / HCAP - suspicious for aspiration in the setting of frail elderly with MBS demonstrating dysphagia.  Recent 3 wk course of abx (amoxicillin for dental procedure).    Acute Hypoxic Respiratory Failure - in setting of above  Plan: Continue coverage for HCAP with vanco / zosyn  Pulmonary hygiene as able, encourage cough.  Consider vibra vest but concerned this may exacerbate delirium in the setting of dementia  Wean O2 for saturations > 90% Trend CXR  Continue albuterol, pulmicort  Continue aggressive medical care as outlined, if patient were to decline, we would need to inform the daughter and she would want comfort measures.   DNR / DNI - see discussion below.    Lasix 20 mg IV x1  Goals of Care - extensive discussion with daughter at bedside regarding goals of care.  Discussed MBS findings with her and impact of chronic aspiration in the elderly.  She agrees that mechanical ventilation, CPR, vasopressors, central lines etc would not be in line with the patient wishes.  If possible, he desires to be at home for a natural death.  Discussed the concept of palliative medicine if needed to help support them at home if he does not respond to current therapies. Hopeful he will turn around with antibiotics.     Canary Brim, NP-C [facility] Pgr: [phone] or [phone] 11/01/2014, 12:52 PM   Attending Note:  I have examined patient, reviewed labs, studies and notes. I have discussed the case with B Ollis, and I agree with the data and plans as amended above. Pt presents with apparent aspiration PNA and mild resp distress, hypoxemia in setting dementia and chronic aspiration. He is stable but on high flow O2. On exam he can converse but clearly has high work to breathe. His dementia impairs his insight into current clinical status. I agree that he would not ultimately survive or benefit from mechanical ventilation or ACLS should he decline. We will plan to support medically with abx, O2. If he does not improve then Palliation would be appropriate.  Independent critical care time is 45 minutes.   Levy Pupa, MD, PhD 11/02/2014, 8:10 AM [facility] [phone] or if no answer [phone]

## 2014-11-01 NOTE — Evaluation (Signed)
Physical Therapy Evaluation Patient Details Name: Corey Farmer MRN: [id number] DOB: 06/23/1926 Today's Date: 11/01/2014   History of Present Illness  81 yo male admitted 11/07/2014 with  increased WOB, cough, resp distress, hypoxic , diagnosed with pneumonia, has h/o dementia, lives with daughter.  Clinical Impression  Patient is very HOH and unable to provide info in regards to prior functional level. Patient is ver weak but did stand and pivot  With 2 person's, max assist. Respiratory placed patient on NRB mask and [place] for transfer activity. RN removed [place]. Patient RR 37, sats >90% . Patient will benefit from PT to address problems listed in note below.    Follow Up Recommendations SNF;Supervision/Assistance - 24 hour    Equipment Recommendations  None recommended by PT    Recommendations for Other Services       Precautions / Restrictions Precautions Precautions: Fall Precaution Comments: requiring high Oxygen  use to keep sats up, on partial NRB,resiratory switched to full NRB for activity as well as 6 L [place].      Mobility  Bed Mobility Overal bed mobility: Needs Assistance Bed Mobility: Supine to Sit     Supine to sit: Total assist;+2 for physical assistance;+2 for safety/equipment;HOB elevated     General bed mobility comments: bed pad used to slide to edge  Transfers Overall transfer level: Needs assistance Equipment used: Rolling walker (2 wheeled) Transfers: Sit to/from [employer] Sit to Stand: Total assist;+2 physical assistance;+2 safety/equipment;From elevated surface Stand pivot transfers: Total assist;+2 physical assistance;+2 safety/equipment       General transfer comment: patient  barely turned enough to land in the recliner,   Ambulation/Gait                Stairs            Wheelchair Mobility    Modified Rankin (Stroke Patients Only)       Balance Overall balance assessment: Needs assistance;History of  Falls Sitting-balance support: Bilateral upper extremity supported;Feet supported Sitting balance-Leahy Scale: Poor   Postural control: Posterior lean                                   Pertinent Vitals/Pain Pain Assessment: Faces Faces Pain Scale: No hurt    Home Living Family/patient expects to be discharged to:: Unsure                 Additional Comments: patient unable to provide info, daughter not present    Prior Function                 Hand Dominance        Extremity/Trunk Assessment   Upper Extremity Assessment: Generalized weakness           Lower Extremity Assessment: LLE deficits/detail   LLE Deficits / Details: noted Foot drop pt stated "I 'm crippled" when asked if it was like thiat before     Communication   Communication: HOH  Cognition Arousal/Alertness: Awake/alert Behavior During Therapy: Restless Overall Cognitive Status: No family/caregiver present to determine baseline cognitive functioning Area of Impairment:  (followed simple commands)                    General Comments      Exercises        Assessment/Plan    PT Assessment Patient needs continued PT services  PT Diagnosis Difficulty walking;Generalized weakness;Altered mental status  PT Problem List Decreased strength;Decreased range of motion;Decreased activity tolerance;Decreased mobility;Decreased knowledge of precautions;Decreased safety awareness;Decreased knowledge of use of DME;Pain;Cardiopulmonary status limiting activity  PT Treatment Interventions DME instruction;Gait training;Functional mobility training;Therapeutic activities;Therapeutic exercise;Patient/family education   PT Goals (Current goals can be found in the Care Plan section) Acute Rehab PT Goals PT Goal Formulation: Patient unable to participate in goal setting Time For Goal Achievement: 11/15/14 Potential to Achieve Goals: Good    Frequency Min 3X/week   Barriers to  discharge Decreased caregiver support      Co-evaluation               End of Session   Activity Tolerance: Patient limited by fatigue Patient left: in chair;with call bell/phone within reach;with nursing/sitter in room Nurse Communication: Mobility status;Need for lift equipment         Time: [id number] PT Time Calculation (min) (ACUTE ONLY): 25 min   Charges:   PT Evaluation $Initial PT Evaluation Tier I: 1 Procedure PT Treatments $Therapeutic Activity: 8-22 mins   PT G Codes:        Rada HayHill, Caress Reffitt Elizabeth 11/01/2014, 12:50 PM Blanchard KelchKaren Caston [place] [facility] [phone]

## 2014-11-01 NOTE — Progress Notes (Addendum)
Speech Language Pathology Treatment: Dysphagia  Patient Details Name: Donna ChristenDonald S Vonderhaar MRN: [id number] DOB: 02-21-1926 Today's Date: 11/01/2014 Time: [id number] SLP Time Calculation (min) (ACUTE ONLY): 16 min  Assessment / Plan / Recommendation Clinical Impression  Pt seen for skilled session to determine dietary tolerance and for pt education.  Per RN, pt with good tolerance of po diet as long as he is fully supervised.  Pt reportedly is impulsive and takes large boluses.    SlP assisted pt to consume room temperature water and his po medications *RN present and observing* - swallow was timely without immediate indication of airway compromise.  Delayed cough noted x1 but pt with gurgly breathing quality at baseline.  RN reports pt is coughing and expectorating some secretions today.    Pt does demonstrate increased respiratory rate which may allow intermittent aspiration and SLP goal is to mitigate.   Note results of CXR today indicating persistent left lung infiltrate, progressive on right, can not exclude pulmonary edema.   Recommend continue diet with strict aspiration precautions.  SLP adapted precaution sign and provided pt with written copy.  Will follow briefly for tolerance and education.     HPI HPI: 81 yo male admitted with 2 day h/o increased WOB.  Pt was found to be hypoxic and bilateral pneumonia was noted on CXR.  Daughter reports pt has a good appetite, but he does cough intermittently during meals, and clears his throat frequently.  Daughter reports frequent choking when taking pills with water, and when drinking large "gulps" of coffee.  Pt underwent MBS yesterday and today is seen to assess po tolerance.     Pertinent Vitals Pain Assessment: No/denies pain  SLP Plan  Continue with current plan of care    Recommendations Diet recommendations: Dysphagia 3 (mechanical soft);Thin liquid Medication Administration: Whole meds with puree (start and follow with room temperature  water) Supervision: Full supervision/cueing for compensatory strategies;Patient able to self feed Compensations: Slow rate;Small sips/bites (rest break if short of breath or coughing) Postural Changes and/or Swallow Maneuvers: Seated upright 90 degrees;Upright 30-60 min after meal              Oral Care Recommendations: Oral care BID Follow up Recommendations: 24 hour supervision/assistance Plan: Continue with current plan of care    GO    Donavan Burnetamara Livy Ross, MS [facility] SLP [phone]

## 2014-11-02 DIAGNOSIS — J189 Pneumonia, unspecified organism: Secondary | ICD-10-CM

## 2014-11-02 DIAGNOSIS — J159 Unspecified bacterial pneumonia: Secondary | ICD-10-CM | POA: Insufficient documentation

## 2014-11-02 LAB — COMPREHENSIVE METABOLIC PANEL
ALBUMIN: 2.7 g/dL — AB (ref 3.5–5.2)
ALT: 27 U/L (ref 0–53)
ANION GAP: 8 (ref 5–15)
AST: 31 U/L (ref 0–37)
Alkaline Phosphatase: 67 U/L (ref 39–117)
BUN: 11 mg/dL (ref 6–23)
CHLORIDE: 96 mmol/L (ref 96–112)
CO2: 28 mmol/L (ref 19–32)
CREATININE: 0.61 mg/dL (ref 0.50–1.35)
Calcium: 8.4 mg/dL (ref 8.4–10.5)
GFR calc Af Amer: >90 mL/min (ref >90)
GFR calc non Af Amer: 88 mL/min — ABNORMAL LOW (ref >90)
Glucose, Bld: 105 mg/dL — ABNORMAL HIGH (ref 70–99)
Potassium: 3.8 mmol/L (ref 3.5–5.1)
Sodium: 132 mmol/L — ABNORMAL LOW (ref 135–145)
TOTAL PROTEIN: 6 g/dL (ref 6.0–8.3)
Total Bilirubin: 0.8 mg/dL (ref 0.3–1.2)

## 2014-11-02 LAB — CBC WITH DIFFERENTIAL/PLATELET
BASOS ABS: 0 10*3/uL (ref 0.0–0.1)
BASOS PCT: 0 % (ref 0–1)
Eosinophils Absolute: 0.2 10*3/uL (ref 0.0–0.7)
Eosinophils Relative: 1 % (ref 0–5)
HCT: 35.1 % — ABNORMAL LOW (ref 39.0–52.0)
Hemoglobin: 11.7 g/dL — ABNORMAL LOW (ref 13.0–17.0)
Lymphocytes Relative: 8 % — ABNORMAL LOW (ref 12–46)
Lymphs Abs: 0.9 10*3/uL (ref 0.7–4.0)
MCH: 30.9 pg (ref 26.0–34.0)
MCHC: 33.3 g/dL (ref 30.0–36.0)
MCV: 92.6 fL (ref 78.0–100.0)
Monocytes Absolute: 1.5 10*3/uL — ABNORMAL HIGH (ref 0.1–1.0)
Monocytes Relative: 13 % — ABNORMAL HIGH (ref 3–12)
NEUTROS PCT: 78 % — AB (ref 43–77)
Neutro Abs: 9.1 10*3/uL — ABNORMAL HIGH (ref 1.7–7.7)
Platelets: 276 10*3/uL (ref 150–400)
RBC: 3.79 MIL/uL — ABNORMAL LOW (ref 4.22–5.81)
RDW: 12.6 % (ref 11.5–15.5)
WBC: 11.6 10*3/uL — ABNORMAL HIGH (ref 4.0–10.5)

## 2014-11-02 LAB — BLOOD GAS, ARTERIAL
Acid-Base Excess: 2.8 mmol/L — ABNORMAL HIGH (ref 0.0–2.0)
BICARBONATE: 27.2 meq/L — AB (ref 20.0–24.0)
Drawn by: 257701
FIO2: 1 %
O2 Saturation: 90.9 %
PATIENT TEMPERATURE: 98.6
PH ART: 7.417 (ref 7.350–7.450)
TCO2: 24.3 mmol/L (ref 0–100)
pCO2 arterial: 43 mmHg (ref 35.0–45.0)
pO2, Arterial: 64 mmHg — ABNORMAL LOW (ref 80.0–100.0)

## 2014-11-02 LAB — VANCOMYCIN, TROUGH: VANCOMYCIN TR: 9.8 ug/mL — AB (ref 10.0–20.0)

## 2014-11-02 MED ORDER — VANCOMYCIN HCL 10 G IV SOLR
1500.0000 mg | INTRAVENOUS | Status: AC
  Administered 2014-11-02: 1500 mg via INTRAVENOUS
  Filled 2014-11-02: qty 1500

## 2014-11-02 MED ORDER — LORAZEPAM 2 MG/ML IJ SOLN
0.5000 mg | Freq: Once | INTRAMUSCULAR | Status: AC
  Administered 2014-11-02: 0.5 mg via INTRAVENOUS
  Filled 2014-11-02: qty 1

## 2014-11-02 MED ORDER — VANCOMYCIN HCL 10 G IV SOLR
1500.0000 mg | Freq: Two times a day (BID) | INTRAVENOUS | Status: DC
  Administered 2014-11-03: 1500 mg via INTRAVENOUS
  Filled 2014-11-02 (×2): qty 1500

## 2014-11-02 NOTE — Progress Notes (Signed)
RT provided PT with CPT- 20 minutes, full range- tolerated well- cough upon request (which is stronger than expected with small white mucus).

## 2014-11-02 NOTE — Progress Notes (Signed)
Name: Corey Farmer MRN: [id number] DOB: 1926-05-04    ADMISSION DATE:  2014-11-27 CONSULTATION DATE:  11/01/14  REFERRING MD :  Dr. Evlyn Kanner   CHIEF COMPLAINT:  Dyspnea   BRIEF PATIENT DESCRIPTION: 81 y/o M with PMH of dementia admitted 2/14 with shortness of breath.  Work up consistent with bilateral PNA.  Concern for aspiration.  PCCM consulted for evaluation.   SIGNIFICANT EVENTS  2/14  Admit with cough, SOB  2/16  PCCM consulted for hypoxic respiratory failure, concern for aspiration PNA  STUDIES:  2/15  Failed bedside swallow evaluation with SLP.  NPO & MBS recommended  2/15  MBS >> mild pharyngeal dysphagia.  D3 diet (mechanical soft) with thin liquids, meds whole with puree   SUBJECTIVE:  Wakes and interact No significant change in his WOB  VITAL SIGNS: Temp:  [97.7 F (36.5 C)-99.8 F (37.7 C)] 99.8 F (37.7 C) (02/17 0800) Pulse Rate:  [93-101] 96 (02/17 0800) Resp:  [27-38] 30 (02/17 0800) BP: (126-170)/(63-77) 138/77 mmHg (02/17 0800) SpO2:  [85 %-95 %] 92 % (02/17 0907) FiO2 (%):  [100 %] 100 % (02/17 0400) Weight:  [96.3 kg (212 lb 4.9 oz)] 96.3 kg (212 lb 4.9 oz) (02/17 0500)  PHYSICAL EXAMINATION: General:  Frail elderly male on NRB Neuro:  Awakens, disoriented but pleasant, MAE but generalized weakness  HEENT:  NCAT, mm pink/moist Cardiovascular:  s1s2 rrr, no m/r/g Lungs:  Tachypnea (28-30), lungs bilaterally with coarse rhonchi, mild accessory muscle use, decreased on R compared w L BS Abdomen:  Obese, soft, BSx4 active  Musculoskeletal:  No acute deformities  Skin:  Warm/dry, 1-2+ BLE edema     Recent Labs Lab 10/31/14 0426 11/01/14 0355 11/02/14 0418  NA 131* 129* 132*  K 4.1 4.3 3.8  CL 97 95* 96  CO2 BUN CREATININE 0.57 0.53 0.61  GLUCOSE 107* 119* 105*    Recent Labs Lab 10/31/14 0426 11/01/14 0355 11/02/14 0418  HGB 11.6* 11.7* 11.7*  HCT 35.3* 35.2* 35.1*  WBC 10.1 11.2* 11.6*  PLT 250 245 276   Dg  Chest 2 View  11/01/2014   CLINICAL DATA:  Pneumonia.  Shortness of breath and coughing  EXAM: CHEST  2 VIEW  COMPARISON:  2014/11/27.  05/09/2012.  FINDINGS: Mediastinum stable. Heart size stable. Diffuse severe left lung pulmonary infiltrate again noted. Progressive severe right lower lobe infiltrate present. These findings are consistent with severe bilateral pneumonia. Pulmonary edema cannot be excluded. Small bilateral pleural effusions cannot be excluded.  IMPRESSION: 1. Persistent diffuse severe left lung infiltrate. Progressive severe right lower lobe infiltrate. Findings are most consistent bilateral pneumonia. Pulmonary edema cannot be excluded. 2. Small pleural effusions. 3. Stable cardiomegaly .   Electronically Signed   By: Maisie Fus  Register   On: 11/01/2014 09:44   Dg Swallowing Func-speech Pathology  10/31/2014   Lenor Derrick, CCC-SLP     10/31/2014  2:36 PM  Objective Swallowing Evaluation: Modified Barium Swallowing Study   Patient Details  Name: Corey Farmer MRN: [id number] Date of Birth: 24-Nov-1925  Today's Date: 10/31/2014 Time: SLP Start Time (ACUTE ONLY): 1340-SLP Stop Time (ACUTE  ONLY): 1415 SLP Time Calculation (min) (ACUTE ONLY): 35 min  Past Medical History:  Past Medical History  Diagnosis Date  . Alzheimer disease   . Pneumonia   . Thyroid disease   . Hypertension   . Hyperlipidemia   . Dementia    Past Surgical History:  Past  Surgical History  Procedure Laterality Date  . Hip surgery    . Lumbar laminectomy     HPI:  HPI: 81 yo male admitted with 2 day h/o increased WOB.  Pt was  found to be hypoxic and bilateral pneumonia was noted on CXR.   Daughter reports pt has a good appetite, but he does cough  intermittently during meals, and clears his throat frequently.   Daughter reports frequent choking when taking pills with water,  and when drinking large "gulps" of coffee.  No Data Recorded  Assessment / Plan / Recommendation CHL IP CLINICAL IMPRESSIONS 10/31/2014  Dysphagia Diagnosis  Mild pharyngeal phase dysphagia  Clinical impression Pt exhibits a mild pharyngeal dysphagia, with  penetration of thin liquid when taking large consecutive  swallows. There was some esophageal residue after the swallow,  and the pill required extra swallows of liquid to clear.  Pt  could have backflow of liquids into the pharynx after swallowing,  however, this was not observed during the study.  While moving  the pt back to the bed, the pt began coughing and his voice  quality was wet.  Daughter reports pt does have a h/o reflux.      CHL IP TREATMENT RECOMMENDATION 10/31/2014  Treatment Plan Recommendations Therapy as outlined in treatment  plan below     CHL IP DIET RECOMMENDATION 10/31/2014  Diet Recommendations Dysphagia 3 (Mechanical Soft);Thin liquid  Liquid Administration via Cup;Straw  Medication Administration Whole meds with puree  Compensations Slow rate;Small sips/bites  Postural Changes and/or Swallow Maneuvers Seated upright 90  degrees;Upright 30-60 min after meal     CHL IP OTHER RECOMMENDATIONS 10/31/2014  Recommended Consults MBS  Oral Care Recommendations Oral care BID  Other Recommendations Clarify dietary restrictions     CHL IP FOLLOW UP RECOMMENDATIONS 10/31/2014  Follow up Recommendations 24 hour supervision/assistance     CHL IP FREQUENCY AND DURATION 10/31/2014  Speech Therapy Frequency (ACUTE ONLY) min 1 x/week  Treatment Duration 1 week     Pertinent Vitals/Pain Low grade fever; CXR: Bilateral pneumonia    SLP Swallow Goals No flowsheet data found.  No flowsheet data found.    CHL IP REASON FOR REFERRAL 10/31/2014  Reason for Referral Objectively evaluate swallowing function     CHL IP ORAL PHASE 10/31/2014  Lips (None)  Tongue (None)  Mucous membranes (None)  Nutritional status (None)  Other (None)  Oxygen therapy (None)  Oral Phase WFL  Oral - Pudding Teaspoon (None)  Oral - Pudding Cup (None)  Oral - Honey Teaspoon (None)  Oral - Honey Cup (None)  Oral - Honey Syringe (None)  Oral - Nectar  Teaspoon (None)  Oral - Nectar Cup (None)  Oral - Nectar Straw (None)  Oral - Nectar Syringe (None)  Oral - Ice Chips (None)  Oral - Thin Teaspoon (None)  Oral - Thin Cup (None)  Oral - Thin Straw (None)  Oral - Thin Syringe (None)  Oral - Puree (None)  Oral - Mechanical Soft (None)  Oral - Regular (None)  Oral - Multi-consistency (None)  Oral - Pill (None)  Oral Phase - Comment (None)      CHL IP PHARYNGEAL PHASE 10/31/2014  Pharyngeal Phase Impaired  Pharyngeal - Pudding Teaspoon (None)  Penetration/Aspiration details (pudding teaspoon) (None)  Pharyngeal - Pudding Cup (None)  Penetration/Aspiration details (pudding cup) (None)  Pharyngeal - Honey Teaspoon (None)  Penetration/Aspiration details (honey teaspoon) (None)  Pharyngeal - Honey Cup (None)  Penetration/Aspiration details (honey cup) (None)  Pharyngeal - Honey Syringe (None)  Penetration/Aspiration details (honey syringe) (None)  Pharyngeal - Nectar Teaspoon (None)  Penetration/Aspiration details (nectar teaspoon) (None)  Pharyngeal - Nectar Cup (None)  Penetration/Aspiration details (nectar cup) (None)  Pharyngeal - Nectar Straw (None)  Penetration/Aspiration details (nectar straw) (None)  Pharyngeal - Nectar Syringe (None)  Penetration/Aspiration details (nectar syringe) (None)  Pharyngeal - Ice Chips (None)  Penetration/Aspiration details (ice chips) (None)  Pharyngeal - Thin Teaspoon (None)  Penetration/Aspiration details (thin teaspoon) (None)  Pharyngeal - Thin Cup (None)  Penetration/Aspiration details (thin cup) (None)  Pharyngeal - Thin Straw (None)  Penetration/Aspiration details (thin straw) (None)  Pharyngeal - Thin Syringe (None)  Penetration/Aspiration details (thin syringe') (None)  Pharyngeal - Puree (None)  Penetration/Aspiration details (puree) (None)  Pharyngeal - Mechanical Soft (None)  Penetration/Aspiration details (mechanical soft) (None)  Pharyngeal - Regular (None)  Penetration/Aspiration details (regular) (None)  Pharyngeal -  Multi-consistency (None)  Penetration/Aspiration details (multi-consistency) (None)  Pharyngeal - Pill (None)  Penetration/Aspiration details (pill) (None)  Pharyngeal Comment (None)     CHL IP CERVICAL ESOPHAGEAL PHASE 10/31/2014  Cervical Esophageal Phase WFL  Pudding Teaspoon (None)  Pudding Cup (None)  Honey Teaspoon (None)  Honey Cup (None)  Honey Syringe (None)  Nectar Teaspoon (None)  Nectar Cup (None)  Nectar Straw (None)  Nectar Syringe (None)  Thin Teaspoon (None)  Thin Cup (None)  Thin Straw (None)  Thin Syringe (None)  Cervical Esophageal Comment (None)    No flowsheet data found.         Maryjo Rochester T 10/31/2014, 2:35 PM     ASSESSMENT / PLAN:  Bilateral Pulmonary Infiltrates / HCAP - probable aspiration in the setting of frail elderly with MBS demonstrating dysphagia.  Recent 3 wk course of abx (amoxicillin for dental procedure).    Acute Hypoxic Respiratory Failure - in setting of above  Plan: Continue coverage for HCAP with vanco / zosyn  Pulmonary hygiene as able, encourage cough.  Will try Vibra Vest 2/17 to see if he will tolerate / benefit Wean O2 for saturations > 90% Trend CXR  Continue albuterol, pulmicort  Continue aggressive medical care as outlined, if patient were to decline, will inform the daughter [name] and she would want comfort measures.  She would be OK with trying BiPAP should his WOB or hypercapnia increase DNR / DNI confirmed 2/16 and 2/17.    Goals of Care - extensive discussion with daughter at bedside regarding goals of care 2/16.  Discussed MBS findings with her and impact of chronic aspiration in the elderly.  She agrees that mechanical ventilation, CPR, vasopressors, central lines etc would not be in line with the patient wishes.  If possible, he desires to be at home for a natural death should he decline.  Discussed the concept of palliative medicine if needed to help support them at home if he does not respond to current therapies. Hopeful he will turn  around with antibiotics.       Levy Pupa, MD, PhD 11/02/2014, 9:43 AM [facility] [phone] or if no answer [phone]

## 2014-11-02 NOTE — Progress Notes (Signed)
eLink Physician-Brief Progress Note Patient Name: Donna ChristenDonald S Lennox DOB: 01/19/1926 MRN: [id number]   Date of Service  11/02/2014  HPI/Events of Note    eICU Interventions  Low dose ativan given for some anxiety, will follow     Intervention Category Intermediate Interventions: Other:  Lyndzee Kliebert S. 11/02/2014, 9:11 PM

## 2014-11-02 NOTE — Progress Notes (Signed)
ANTIBIOTIC CONSULT NOTE - FOLLOW UP  Pharmacy Consult for Vancomycin, Zosyn Indication: rule out pneumonia  Allergies  Allergen Reactions  . Sulfur Other (See Comments)    unknown    Patient Measurements: Height: 5\' 11"  (180.3 cm) Weight: 212 lb 4.9 oz (96.3 kg) IBW/kg (Calculated) : 75.3  Vital Signs: Temp: 99.8 F (37.7 C) (02/17 0800) Temp Source: Axillary (02/17 0800) BP: 138/77 mmHg (02/17 0800) Pulse Rate: 96 (02/17 0800) Intake/Output from previous day: 02/16 0701 - 02/17 0700 In: 1920 [P.O.:120; I.V.:1150; IV Piggyback:650] Out: 650 [Urine:650]  Labs:  Recent Labs  10/31/14 0426 11/01/14 0355 11/02/14 0418  WBC 10.1 11.2* 11.6*  HGB 11.6* 11.7* 11.7*  PLT 250 245 276  CREATININE 0.57 0.53 0.61   Estimated Creatinine Clearance: 78.5 mL/min (by C-G formula based on Cr of 0.61).  Recent Labs  11/02/14 1326  VANCOTROUGH 9.8*     Assessment: [age] yo male admitted 2/14 from home with SOB, hypoxia, and concern for aspiration. CXR shows bilateral pneumonia. He has had recent dental procedures (tooth pulled ~10 days ago) and been on amoxicillin for several weeks.He has had difficulty swallowing liquids. Pharmacy is consulted to dose vancomycin and zosyn for pneumonia.  2/14 >> Vanc >> 2/14 >> Zosyn >>  Today, 11/02/2014:  Day # 4 Vancomycin and Zosyn  Tmax: 99.8  WBC: improved to WNL 2/15, now increased to 11.6  Renal: SCr 0.61, CrCl ~ 78 ml/min  Vancomycin trough level: 9.8, below goal level   Goal of Therapy:  Vancomycin trough level 15-20 mcg/ml  Plan:   Continue Zosyn 3.375g IV Q8H infused over 4hrs.   Increase to Vancomycin 1500 mg IV q12h.  Measure Vanc trough at steady state.  Follow up renal fxn, culture results, and clinical course.   Lynann Beaverhristine Danaja Lasota PharmD, BCPS Pager [phone] 11/02/2014 11:05 AM

## 2014-11-02 NOTE — Progress Notes (Signed)
Pt anxious, increased wob noted, o2 sats 70-80s on nrb and 6lnc.  Therefore, cpt not done at this time.  RN aware.

## 2014-11-02 NOTE — Progress Notes (Signed)
Subjective: Doing fair. Coughing up quite a bit. Didn't eat much at breakfast. No pain   Objective: Vital signs in last 24 hours: Temp:  [97.7 F (36.5 C)-99.8 F (37.7 C)] 99.8 F (37.7 C) (02/17 0800) Pulse Rate:  [93-101] 96 (02/17 0400) Resp:  [27-38] 38 (02/17 0400) BP: (126-170)/(63-75) 143/63 mmHg (02/17 0400) SpO2:  [85 %-95 %] 92 % (02/17 0907) FiO2 (%):  [100 %] 100 % (02/17 0400) Weight:  [96.3 kg (212 lb 4.9 oz)] 96.3 kg (212 lb 4.9 oz) (02/17 0500)  Intake/Output from previous day: 02/16 0701 - 02/17 0700 In: 1920 [P.O.:120; I.V.:1150; IV Piggyback:650] Out: 650 [Urine:650] Intake/Output this shift:    Less well. Arcus senilis. Lungs bilat rhonchi, more on right. Ht regular, a bit faster. abd sl distended. 1+edema. Will awaken and talk some  Lab Results   Recent Labs  11/01/14 0355 11/02/14 0418  WBC 11.2* 11.6*  RBC 3.77* 3.79*  HGB 11.7* 11.7*  HCT 35.2* 35.1*  MCV 93.4 92.6  MCH 31.0 30.9  RDW 12.4 12.6  PLT 245 276    Recent Labs  11/01/14 0355 11/02/14 0418  NA 129* 132*  K 4.3 3.8  CL 95* 96  CO2 27 28  GLUCOSE 119* 105*  BUN 9 11  CREATININE 0.53 0.61  CALCIUM 8.4 8.4    Studies/Results: Dg Chest 2 View  11/01/2014   CLINICAL DATA:  Pneumonia.  Shortness of breath and coughing  EXAM: CHEST  2 VIEW  COMPARISON:  2014/11/14.  05/09/2012.  FINDINGS: Mediastinum stable. Heart size stable. Diffuse severe left lung pulmonary infiltrate again noted. Progressive severe right lower lobe infiltrate present. These findings are consistent with severe bilateral pneumonia. Pulmonary edema cannot be excluded. Small bilateral pleural effusions cannot be excluded.  IMPRESSION: 1. Persistent diffuse severe left lung infiltrate. Progressive severe right lower lobe infiltrate. Findings are most consistent bilateral pneumonia. Pulmonary edema cannot be excluded. 2. Small pleural effusions. 3. Stable cardiomegaly .   Electronically Signed   By: Maisie Fus  Register    On: 11/01/2014 09:44   Dg Swallowing Func-speech Pathology  10/31/2014   Lenor Derrick, CCC-SLP     10/31/2014  2:36 PM  Objective Swallowing Evaluation: Modified Barium Swallowing Study   Patient Details  Name: Corey Farmer MRN: [id number] Date of Birth: 1926-05-31  Today's Date: 10/31/2014 Time: SLP Start Time (ACUTE ONLY): 1340-SLP Stop Time (ACUTE  ONLY): 1415 SLP Time Calculation (min) (ACUTE ONLY): 35 min  Past Medical History:  Past Medical History  Diagnosis Date  . Alzheimer disease   . Pneumonia   . Thyroid disease   . Hypertension   . Hyperlipidemia   . Dementia    Past Surgical History:  Past Surgical History  Procedure Laterality Date  . Hip surgery    . Lumbar laminectomy     HPI:  HPI: 81 yo male admitted with 2 day h/o increased WOB.  Pt was  found to be hypoxic and bilateral pneumonia was noted on CXR.   Daughter reports pt has a good appetite, but he does cough  intermittently during meals, and clears his throat frequently.   Daughter reports frequent choking when taking pills with water,  and when drinking large "gulps" of coffee.  No Data Recorded  Assessment / Plan / Recommendation CHL IP CLINICAL IMPRESSIONS 10/31/2014  Dysphagia Diagnosis Mild pharyngeal phase dysphagia  Clinical impression Pt exhibits a mild pharyngeal dysphagia, with  penetration of thin liquid when taking large consecutive  swallows. There  was some esophageal residue after the swallow,  and the pill required extra swallows of liquid to clear.  Pt  could have backflow of liquids into the pharynx after swallowing,  however, this was not observed during the study.  While moving  the pt back to the bed, the pt began coughing and his voice  quality was wet.  Daughter reports pt does have a h/o reflux.      CHL IP TREATMENT RECOMMENDATION 10/31/2014  Treatment Plan Recommendations Therapy as outlined in treatment  plan below     CHL IP DIET RECOMMENDATION 10/31/2014  Diet Recommendations Dysphagia 3 (Mechanical Soft);Thin liquid   Liquid Administration via Cup;Straw  Medication Administration Whole meds with puree  Compensations Slow rate;Small sips/bites  Postural Changes and/or Swallow Maneuvers Seated upright 90  degrees;Upright 30-60 min after meal     CHL IP OTHER RECOMMENDATIONS 10/31/2014  Recommended Consults MBS  Oral Care Recommendations Oral care BID  Other Recommendations Clarify dietary restrictions     CHL IP FOLLOW UP RECOMMENDATIONS 10/31/2014  Follow up Recommendations 24 hour supervision/assistance     CHL IP FREQUENCY AND DURATION 10/31/2014  Speech Therapy Frequency (ACUTE ONLY) min 1 x/week  Treatment Duration 1 week     Pertinent Vitals/Pain Low grade fever; CXR: Bilateral pneumonia    SLP Swallow Goals No flowsheet data found.  No flowsheet data found.    CHL IP REASON FOR REFERRAL 10/31/2014  Reason for Referral Objectively evaluate swallowing function     CHL IP ORAL PHASE 10/31/2014  Lips (None)  Tongue (None)  Mucous membranes (None)  Nutritional status (None)  Other (None)  Oxygen therapy (None)  Oral Phase WFL  Oral - Pudding Teaspoon (None)  Oral - Pudding Cup (None)  Oral - Honey Teaspoon (None)  Oral - Honey Cup (None)  Oral - Honey Syringe (None)  Oral - Nectar Teaspoon (None)  Oral - Nectar Cup (None)  Oral - Nectar Straw (None)  Oral - Nectar Syringe (None)  Oral - Ice Chips (None)  Oral - Thin Teaspoon (None)  Oral - Thin Cup (None)  Oral - Thin Straw (None)  Oral - Thin Syringe (None)  Oral - Puree (None)  Oral - Mechanical Soft (None)  Oral - Regular (None)  Oral - Multi-consistency (None)  Oral - Pill (None)  Oral Phase - Comment (None)      CHL IP PHARYNGEAL PHASE 10/31/2014  Pharyngeal Phase Impaired  Pharyngeal - Pudding Teaspoon (None)  Penetration/Aspiration details (pudding teaspoon) (None)  Pharyngeal - Pudding Cup (None)  Penetration/Aspiration details (pudding cup) (None)  Pharyngeal - Honey Teaspoon (None)  Penetration/Aspiration details (honey teaspoon) (None)  Pharyngeal - Honey Cup (None)   Penetration/Aspiration details (honey cup) (None)  Pharyngeal - Honey Syringe (None)  Penetration/Aspiration details (honey syringe) (None)  Pharyngeal - Nectar Teaspoon (None)  Penetration/Aspiration details (nectar teaspoon) (None)  Pharyngeal - Nectar Cup (None)  Penetration/Aspiration details (nectar cup) (None)  Pharyngeal - Nectar Straw (None)  Penetration/Aspiration details (nectar straw) (None)  Pharyngeal - Nectar Syringe (None)  Penetration/Aspiration details (nectar syringe) (None)  Pharyngeal - Ice Chips (None)  Penetration/Aspiration details (ice chips) (None)  Pharyngeal - Thin Teaspoon (None)  Penetration/Aspiration details (thin teaspoon) (None)  Pharyngeal - Thin Cup (None)  Penetration/Aspiration details (thin cup) (None)  Pharyngeal - Thin Straw (None)  Penetration/Aspiration details (thin straw) (None)  Pharyngeal - Thin Syringe (None)  Penetration/Aspiration details (thin syringe') (None)  Pharyngeal - Puree (None)  Penetration/Aspiration details (puree) (None)  Pharyngeal - Mechanical  Soft (None)  Penetration/Aspiration details (mechanical soft) (None)  Pharyngeal - Regular (None)  Penetration/Aspiration details (regular) (None)  Pharyngeal - Multi-consistency (None)  Penetration/Aspiration details (multi-consistency) (None)  Pharyngeal - Pill (None)  Penetration/Aspiration details (pill) (None)  Pharyngeal Comment (None)     CHL IP CERVICAL ESOPHAGEAL PHASE 10/31/2014  Cervical Esophageal Phase WFL  Pudding Teaspoon (None)  Pudding Cup (None)  Honey Teaspoon (None)  Honey Cup (None)  Honey Syringe (None)  Nectar Teaspoon (None)  Nectar Cup (None)  Nectar Straw (None)  Nectar Syringe (None)  Thin Teaspoon (None)  Thin Cup (None)  Thin Straw (None)  Thin Syringe (None)  Cervical Esophageal Comment (None)    No flowsheet data found.         Maryjo RochesterWillis, Lori T 10/31/2014, 2:35 PM     Scheduled Meds: . albuterol  2.5 mg Nebulization Q4H  . antiseptic oral rinse  7 mL Mouth Rinse q12n4p  .  budesonide  0.5 mg Nebulization BID  . chlorhexidine  15 mL Mouth Rinse BID  . donepezil  10 mg Oral QHS  . enoxaparin (LOVENOX) injection  40 mg Subcutaneous Q24H  . levothyroxine  175 mcg Oral QAC breakfast  . memantine  10 mg Oral BID  . pantoprazole  40 mg Oral Daily  . piperacillin-tazobactam (ZOSYN)  IV  3.375 g Intravenous Q8H  . saccharomyces boulardii  500 mg Oral BID  . simvastatin  20 mg Oral QPM  . vancomycin  1,250 mg Intravenous Q12H  . venlafaxine XR  75 mg Oral Daily   Continuous Infusions: . sodium chloride 50 mL/hr at 11/01/14 0119   PRN Meds:acetaminophen **OR** acetaminophen, ondansetron **OR** ondansetron (ZOFRAN) IV  Assessment/Plan:  PNEUMONIA: a bit worse. To consider possible BIPAP or VibraVest MCI/DEMENTIA: at his baseline  HYPOTHYROID: on Rx GAIT DISORDER: present for decades, has scissor gait and instability HYPONATREMIA: at his baseline, stays a bit under 130, stable HYPERLIPIDEMIA: on Rx SWALLOWING ISSUES: on D3 diet LIMITED CODE   LOS: 3 days   Udell Mazzocco ALAN 11/02/2014, 9:24 AM

## 2014-11-03 ENCOUNTER — Inpatient Hospital Stay (HOSPITAL_COMMUNITY): Payer: Medicare Other

## 2014-11-03 DIAGNOSIS — Z515 Encounter for palliative care: Secondary | ICD-10-CM

## 2014-11-03 DIAGNOSIS — J189 Pneumonia, unspecified organism: Secondary | ICD-10-CM | POA: Insufficient documentation

## 2014-11-03 DIAGNOSIS — Z66 Do not resuscitate: Secondary | ICD-10-CM

## 2014-11-03 LAB — BASIC METABOLIC PANEL
ANION GAP: 7 (ref 5–15)
BUN: 16 mg/dL (ref 6–23)
CALCIUM: 8.4 mg/dL (ref 8.4–10.5)
CHLORIDE: 100 mmol/L (ref 96–112)
CO2: 29 mmol/L (ref 19–32)
Creatinine, Ser: 0.62 mg/dL (ref 0.50–1.35)
GFR calc Af Amer: >90 mL/min (ref >90)
GFR calc non Af Amer: 87 mL/min — ABNORMAL LOW (ref >90)
GLUCOSE: 92 mg/dL (ref 70–99)
Potassium: 4 mmol/L (ref 3.5–5.1)
Sodium: 136 mmol/L (ref 135–145)

## 2014-11-03 LAB — CBC
HEMATOCRIT: 37 % — AB (ref 39.0–52.0)
Hemoglobin: 12.1 g/dL — ABNORMAL LOW (ref 13.0–17.0)
MCH: 30.8 pg (ref 26.0–34.0)
MCHC: 32.7 g/dL (ref 30.0–36.0)
MCV: 94.1 fL (ref 78.0–100.0)
PLATELETS: 255 10*3/uL (ref 150–400)
RBC: 3.93 MIL/uL — ABNORMAL LOW (ref 4.22–5.81)
RDW: 12.5 % (ref 11.5–15.5)
WBC: 13.8 10*3/uL — ABNORMAL HIGH (ref 4.0–10.5)

## 2014-11-03 LAB — MAGNESIUM: Magnesium: 1.9 mg/dL (ref 1.5–2.5)

## 2014-11-03 LAB — PHOSPHORUS: PHOSPHORUS: 4.4 mg/dL (ref 2.3–4.6)

## 2014-11-03 MED ORDER — MORPHINE SULFATE 25 MG/ML IV SOLN
0.5000 mg/h | INTRAVENOUS | Status: DC
  Filled 2014-11-03: qty 10

## 2014-11-03 MED ORDER — MORPHINE SULFATE 2 MG/ML IJ SOLN
2.0000 mg | INTRAMUSCULAR | Status: DC | PRN
Start: 2014-11-03 — End: 2014-11-03
  Administered 2014-11-03 (×2): 4 mg via INTRAVENOUS
  Filled 2014-11-03 (×2): qty 2

## 2014-11-03 MED ORDER — DEXTROSE 5 % IV SOLN
1.0000 mg/h | INTRAVENOUS | Status: DC
  Filled 2014-11-03: qty 25

## 2014-11-03 MED ORDER — LORAZEPAM 2 MG/ML IJ SOLN
1.0000 mg | INTRAMUSCULAR | Status: DC | PRN
  Administered 2014-11-03: 2 mg via INTRAVENOUS
  Filled 2014-11-03: qty 1

## 2014-11-03 MED ORDER — ATROPINE SULFATE 1 % OP SOLN
2.0000 [drp] | Freq: Four times a day (QID) | OPHTHALMIC | Status: DC | PRN
  Filled 2014-11-03: qty 2

## 2014-11-15 NOTE — Progress Notes (Signed)
Ordered TID CPT not completed at 1400 rounds due to PT condition.

## 2014-11-15 NOTE — Care Management Note (Signed)
CARE MANAGEMENT NOTE 11/02/2014  Patient:  Corey Farmer,Corey Farmer   Account Number:  [id number]  Date Initiated:  10/31/2014  Documentation initiated by:  DAVIS,RHONDA  Subjective/Objective Assessment:   pt with hx of pna with failed outpt treatment and increased wob,o2 on admit 82% pna confirmed by cxr     Action/Plan:   lives with adult children   Anticipated DC Date:  11/06/2014   Anticipated DC Plan:  HOME/SELF CARE  In-house referral  NA         PAC Choice  NA   Choice offered to / List presented to:     DME arranged  NA           HH agency  NA   Status of service:  In process, will continue to follow Medicare Important Message given?   (If response is "NO", the following Medicare IM given date fields will be blank) Date Medicare IM given:   Medicare IM given by:   Date Additional Medicare IM given:   Additional Medicare IM given by:    Discharge Disposition:    Per UR Regulation:  Reviewed for med. necessity/level of care/duration of stay  If discussed at Long Length of Stay Meetings, dates discussed:    Comments:  Feb. 18 2016/Rhonda L. Earlene Plateravis, RN, BSN, CCM/Case Management [facility] [phone] No discharge needs present of time of review. patient continues to require 100% fio2 to keep o2 level above 90%.  Pallative care consult being concerned by the family.  Feb. 15 2016/Rhonda L. Earlene Plateravis, RN, BSN, CCM/Case Management [facility] [phone] No discharge needs present of time of review.

## 2014-11-15 NOTE — Progress Notes (Signed)
   RN calling eMD  Patient in resp distress and cheyne stokes - terminal weaned - on prn morphine/ativan but insufficient per RN  Plan Morphine and ativan gtt  Patient Active Problem List   Diagnosis Date Noted  . DNAR (do not attempt resuscitation) 2014/10/02  . Terminal patient care 2014/10/02  . CAP (community acquired pneumonia)   . Community acquired bacterial pneumonia   . Sepsis due to urinary tract infection 05/08/2012  . Hyponatremia 05/08/2012  . Dementia 05/08/2012  . Gait disorder 05/08/2012  . Pneumonia 05/08/2012  . Leukocytosis 05/08/2012  . Dehydration 05/08/2012  . Anemia 05/08/2012  . Hypothyroid 05/08/2012  . Hypertension 05/08/2012  . Depression 05/08/2012  . GERD (gastroesophageal reflux disease) 05/08/2012  . Diarrhea 05/08/2012  . COPD (chronic obstructive pulmonary disease) 05/08/2012   Dr. Kalman ShanMurali Marlan Steward, M.D., F.C.C.P Pulmonary and Critical Care Medicine Staff Physician [facility] Pager: [phone], If no answer or between  15:00h - 7:00h: call [phone]  February 28, 2015 3:10 PM

## 2014-11-15 NOTE — Progress Notes (Signed)
Patient ID: Corey Farmer, male   DOB: [birth year], 81 y.o.   MRN: [id number]  Pt has declined and is actively dying. Family and clergy at bedside. Pt is comfortable on NRB. CCM help greatly appreciated  Assunta CurtisStephen A Heinz Eckert MD Riki RuskFACE FACP [facility]

## 2014-11-15 NOTE — Progress Notes (Signed)
SLP Cancellation Note  Patient Details Name: Corey Farmer MRN: [id number] DOB: 1925-10-31   Cancelled treatment:       Reason Eval/Treat Not Completed:  (will sign off, note decline in medical status and plan to de-escalate care)   Donavan Burnetamara Ala [place], MS [facility] SLP [phone]

## 2014-11-15 NOTE — Progress Notes (Signed)
Name: Corey Farmer MRN: [id number] DOB: 10-Dec-1925    ADMISSION DATE:  11/12/2014 CONSULTATION DATE:  11/01/14  REFERRING MD :  Dr. Evlyn KannerSouth   CHIEF COMPLAINT:  Dyspnea   BRIEF PATIENT DESCRIPTION: 81 y/o M with PMH of dementia admitted 2/14 with shortness of breath.  Work up consistent with bilateral PNA.  Concern for aspiration.  PCCM consulted for evaluation.   SIGNIFICANT EVENTS  2/14  Admit with cough, SOB  2/16  PCCM consulted for hypoxic respiratory failure, concern for aspiration PNA  STUDIES:  2/15  Failed bedside swallow evaluation with SLP.  NPO & MBS recommended  2/15  MBS >> mild pharyngeal dysphagia.  D3 diet (mechanical soft) with thin liquids, meds whole with puree 2/16  DNR    SUBJECTIVE:  Episodes of anxiety / increased WOB overnight, desaturations into 70's despite NRB + 6L    VITAL SIGNS: Temp:  [97.5 F (36.4 C)-99 F (37.2 C)] 98.8 F (37.1 C) (02/18 0355) Pulse Rate:  [79-107] 90 (02/18 0400) Resp:  [17-43] 37 (02/18 0400) BP: (120-149)/(49-75) 149/72 mmHg (02/18 0400) SpO2:  [86 %-95 %] 95 % (02/18 0736) FiO2 (%):  [100 %] 100 % (02/18 0416)  PHYSICAL EXAMINATION: General:  Frail elderly male on NRB Neuro:  More lethargic, mild arousal with verbal stimulation  HEENT:  NCAT, mm pink/moist Cardiovascular:  s1s2 rrr, no m/r/g Lungs:  Tachypnea, lungs bilaterally with coarse rhonchi Abdomen:  Obese, soft, BSx4 active  Musculoskeletal:  No acute deformities  Skin:  Warm/dry, 1-2+ BLE edema     Recent Labs Lab 11/01/14 0355 11/02/14 0418 06-19-15 0345  NA 129* 132* 136  K 4.3 3.8 4.0  CL 95* 96 100  CO2 27 28 29   BUN 9 11 16   CREATININE 0.53 0.61 0.62  GLUCOSE 119* 105* 92    Recent Labs Lab 11/01/14 0355 11/02/14 0418 06-19-15 0345  HGB 11.7* 11.7* 12.1*  HCT 35.2* 35.1* 37.0*  WBC 11.2* 11.6* 13.8*  PLT 245 276 255   Dg Chest 2 View  11/01/2014   CLINICAL DATA:  Pneumonia.  Shortness of breath and coughing  EXAM: CHEST  2  VIEW  COMPARISON:  10/21/2014.  05/09/2012.  FINDINGS: Mediastinum stable. Heart size stable. Diffuse severe left lung pulmonary infiltrate again noted. Progressive severe right lower lobe infiltrate present. These findings are consistent with severe bilateral pneumonia. Pulmonary edema cannot be excluded. Small bilateral pleural effusions cannot be excluded.  IMPRESSION: 1. Persistent diffuse severe left lung infiltrate. Progressive severe right lower lobe infiltrate. Findings are most consistent bilateral pneumonia. Pulmonary edema cannot be excluded. 2. Small pleural effusions. 3. Stable cardiomegaly .   Electronically Signed   By: Maisie Fushomas  Register   On: 11/01/2014 09:44   Dg Chest Port 1 View  08/07/2015   CLINICAL DATA:  Pneumonia  EXAM: PORTABLE CHEST - 1 VIEW  COMPARISON:  11/01/2014  FINDINGS: There is hyperinflation and cardiomegaly. Chronic calcific changes are present in the apices, left greater than right. Superimposed diffuse airspace opacities persist, probably with mild worsening in the left central lung. No large effusions are evident.  IMPRESSION: Persistent diffuse airspace opacities, probably mildly worsened.   Electronically Signed   By: Ellery Plunkaniel R Mitchell M.D.   On: 011/21/2016 05:07    ASSESSMENT / PLAN:  Bilateral Pulmonary Infiltrates / HCAP - probable aspiration in the setting of frail elderly with MBS demonstrating dysphagia.  Recent 3 wk course of abx (amoxicillin for dental procedure).    Acute Hypoxic Respiratory Failure -  in setting of above  Plan: Continue coverage for HCAP with vanco / zosyn, D5/x Pulmonary hygiene as able, encourage cough.  Will try Vibra Vest 2/17 to see if he will tolerate / benefit Wean O2 for saturations > 90% Trend CXR  Continue albuterol, pulmicort  Continue aggressive medical care as outlined, if patient were to decline, will inform the daughter Corey Farmer and she would want comfort measures.  She would be OK with trying BiPAP should his WOB or  hypercapnia increase.  At this point, BiPAP may be more of a risk.   DNR / DNI    Goals of Care - extensive discussion with daughter at bedside regarding goals of care 2/16.  Discussed MBS findings with her and impact of chronic aspiration in the elderly.  She agrees that mechanical ventilation, CPR, vasopressors, central lines etc would not be in line with the patient wishes.  If possible, he desires to be at home for a natural death should he decline.  Discussed the concept of palliative medicine if needed to help support them at home if he does not respond to current therapies. Hopeful he will turn around with antibiotics.      Feel he has declined overnight 2/17 into the am of 2/18.  Will discuss with his daughter, may be able to transition home per his wishes.   Canary Brim, NP-C [facility] Pgr: [phone] or [phone] Nov 29, 2014, 8:24 AM   Attending Note:  I have examined patient, reviewed labs, studies and notes. I have discussed the case with B Ollis, and I agree with the data and plans as amended above. The patient is slowly declining. Discussed with his daughter Corey Farmer at bedside, also with Dr Evlyn Kanner. Corey Farmer is having the family gather in preparation to de-escalate his care today. Suspect he will pass away 2/18. Will support his family, manage his sx.   Levy Pupa, MD, PhD 11-29-14, 11:11 AM [facility] [phone] or if no answer [phone]

## 2014-11-15 NOTE — Progress Notes (Signed)
Family arrived at bedside.  Patient with increased respiratory effort.    Plan: PRN morphine PRN atropine gtt's for increased secretions Full comfort care D/C all orders that do not contribute to comfort.   Canary BrimBrandi Jozlyn Schatz, NP-C [facility] Pgr: [phone] or [phone]

## 2014-11-15 NOTE — Progress Notes (Signed)
Pt TOD 1540. Absence of lung sounds and heart auscultated by Drue Stageraroline Emonie Espericueta and Redgie GrayerPamela West. Pt family present at the bedside and pt belongings given to patient when they left. MD Notified.

## 2014-11-15 NOTE — Progress Notes (Signed)
PT Cancellation Note and Discharge from PT  Patient Details Name: Corey Farmer MRN: [id number] DOB: 1925-10-19   Cancelled Treatment:    Reason Eval/Treat Not Completed: Medical issues which prohibited therapy Pt transitioning to comfort care.  Spoke to RN who reports PT to sign off at this time.   Benzion Mesta,KATHrine E December 09, 2014, 11:55 AM Zenovia JarredKati Nafeesah Lapaglia, PT, DPT December 09, 2014 Pager: [phone]

## 2014-11-15 NOTE — Progress Notes (Signed)
Pt has had family members, friends, and pastors at the bedside today. No spiritual care consult needed. Thanks!

## 2014-11-15 DEATH — deceased

## 2014-11-21 NOTE — Discharge Summary (Signed)
DISCHARGE SUMMARY  Corey Farmer  MR#: [id number]  DOB:07-30-1926  Date of Admission: 10/21/2014 Date of Discharge:  11/28/2014  Attending Physician:Kathrynn Backstrom ALAN  Patient's NWG:[id number],HYQMVHQ Hessie Diener, MD  Consults: PULM/CCM, PALLIATIVE CARE  Discharge Diagnoses: CAUSE OF DEATH: RESPIRATORY FAILURE    Community acquired bacterial pneumonia     DNAR (do not attempt resuscitation)/  Terminal patient care  MCI/DEMENTIA:    HYPOTHYROID:   GAIT DISORDER:   HYPONATREMIA:   HYPERLIPIDEMIA:   SWALLOWING ISSUES:       Hospital Procedures:  BIPAP   History of Present Illness: Fever and cough  Hospital Course: Mr Kindle was a 81YO WM with a hx of thyroid disease, gait disorder, hyponatremia. He presented with severe community acquired pneumonia, He was treated with  Broad spectrum abx, supplemental oxygen, and nebulizers. Initially he made some progress.  He was able to eat and communicate in the first 72 hrs or so. He then has a worsening of oxygenation and progressive bilateral infiltrates. He was seen by CCM and many discussions were undertaken with the family and the patient. BIPAP was briefly used but was not tolerated very well. He was seen by SLP and was found to have some component of aspiration.  When he worsened, the family made the decision to pursue comfort measures. He died quietly with the family at the bedside.  Cause of death was respiratory failure due to community acquired pneumonia.          Labs:  Results for Corey Farmer, Corey Farmer (MRN [id number]) as of 11/21/2014 13:44  Ref. Range 11/02/2014 12:48 11/02/2014 13:26 11-28-14 03:45  Sample type No range found ARTERIAL DRAW    Delivery systems No range found NON-REBREATHER OX...    FIO2 Latest Units: % 1.00    pH, Arterial Latest Range: 7.350-7.450  7.417    pCO2 arterial Latest Range: 35.0-45.0 mmHg 43.0    pO2, Arterial Latest Range: 80.0-100.0 mmHg 64.0 (L)    Bicarbonate Latest Range: 20.0-24.0 mEq/L 27.2 (H)     TCO2 Latest Range: 0-100 mmol/L 24.3    Acid-Base Excess Latest Range: 0.0-2.0 mmol/L 2.8 (H)    O2 Saturation Latest Units: % 90.9    Patient temperature No range found 98.6    Collection site No range found RIGHT RADIAL    Sodium Latest Range: 135-145 mmol/L   136  Potassium Latest Range: 3.5-5.1 mmol/L   4.0  Chloride Latest Range: 96-112 mmol/L   100  CO2 Latest Range: 19-32 mmol/L   29  BUN Latest Range: 6-23 mg/dL   16  Creatinine Latest Range: 0.50-1.35 mg/dL   4.13  Calcium Latest Range: 8.4-10.5 mg/dL   8.4  GFR calc non Af Amer Latest Range: >90 mL/min   87 (L)  GFR calc Af Amer Latest Range: >90 mL/min   >90  Glucose Latest Range: 70-99 mg/dL   92  Anion gap Latest Range: 5-15    7  Phosphorus Latest Range: 2.3-4.6 mg/dL   4.4  Magnesium Latest Range: 1.5-2.5 mg/dL   1.9    Results for Corey Farmer, Corey Farmer (MRN [id number]) as of 11/21/2014 13:44  Ref. Range 11/02/2014 04:18  WBC Latest Range: 4.0-10.5 K/uL 11.6 (H)  RBC Latest Range: 4.22-5.81 MIL/uL 3.79 (L)  Hemoglobin Latest Range: 13.0-17.0 g/dL 53.6 (L)  HCT Latest Range: 39.0-52.0 % 35.1 (L)  MCV Latest Range: 78.0-100.0 fL 92.6  MCH Latest Range: 26.0-34.0 pg 30.9  MCHC Latest Range: 30.0-36.0 g/dL 64.4  RDW Latest Range: 11.5-15.5 % 12.6  Platelets  Latest Range: 150-400 K/uL 276    CLINICAL DATA: Pneumonia  EXAM:PORTABLE CHEST - 1 VIEW  COMPARISON: 11/01/2014  FINDINGS: There is hyperinflation and cardiomegaly. Chronic calcific changes are present in the apices, left greater than right. Superimposed diffuse airspace opacities persist, probably with mild worsening in the left central lung. No large effusions are evident.  IMPRESSION: Persistent diffuse airspace opacities, probably mildly worsened.   On: 10/31/2014 05:07  02/14 CLINICAL DATA: hx dementia. Family reports pt has had increased weakness and confusion for the past 4 days. Family recently got over a cold. O2 sats 83% on RA. Put pt on 6L  [place] which increased O2 sats to 90. Pt has audible rhonchi and coughed up blood tinged sputum with EMS. Family reports pt had similar symptoms 3 years ago and was diagnosed with a UTI. H/o alzheimer's, pneumonia. Family reports was on HTN medication, but recently was taken off of it. Former smoker  EXAM: CHEST - 2 VIEW  COMPARISON: 05/09/2012  FINDINGS: Coarse airspace opacities throughout the left lung, new since previous exam. Coarse interstitial and patchy airspace opacities at the right lung base are largely stable. Attenuated right upper lobe bronchovascular markings. Mild cardiomegaly stable. No definite effusion. Atheromatous aortic arch. Visualized skeletal structures are unremarkable.  IMPRESSION: 1. New extensive left lung airspace opacities suggesting pneumonia or asymmetric edema.   Signed: Julian HySOUTH,Ren Grasse ALAN 11/21/2014, 1:33 PM

## 2016-01-06 IMAGING — CR DG CHEST 2V
2 series · 2 of 2 positions shown · non-contrast
Comparison: 10/30/2014.  05/09/2012.

CLINICAL DATA: Pneumonia.  Shortness of breath and coughing

EXAM:
CHEST  2 VIEW

[w chest lat *]
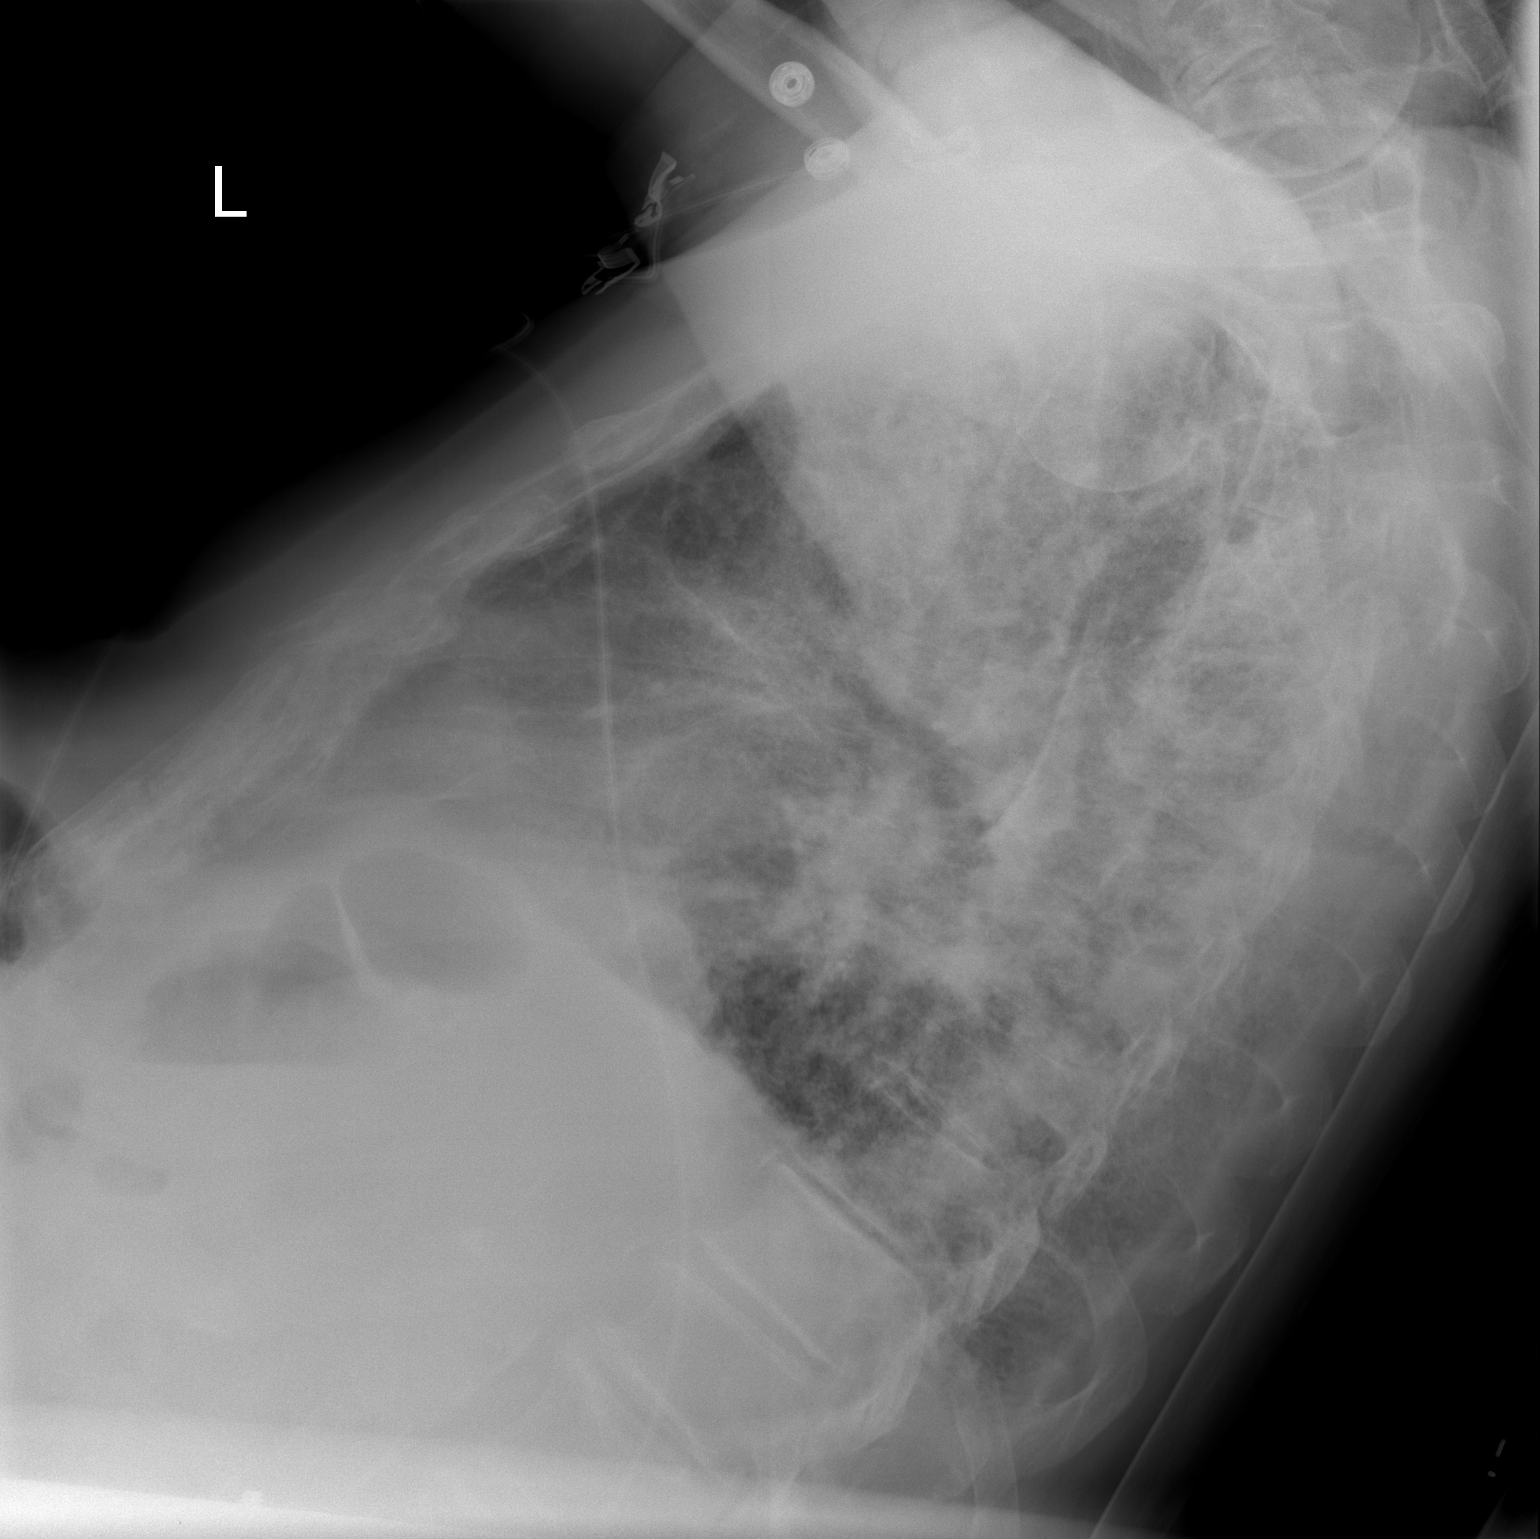

[view not recorded]
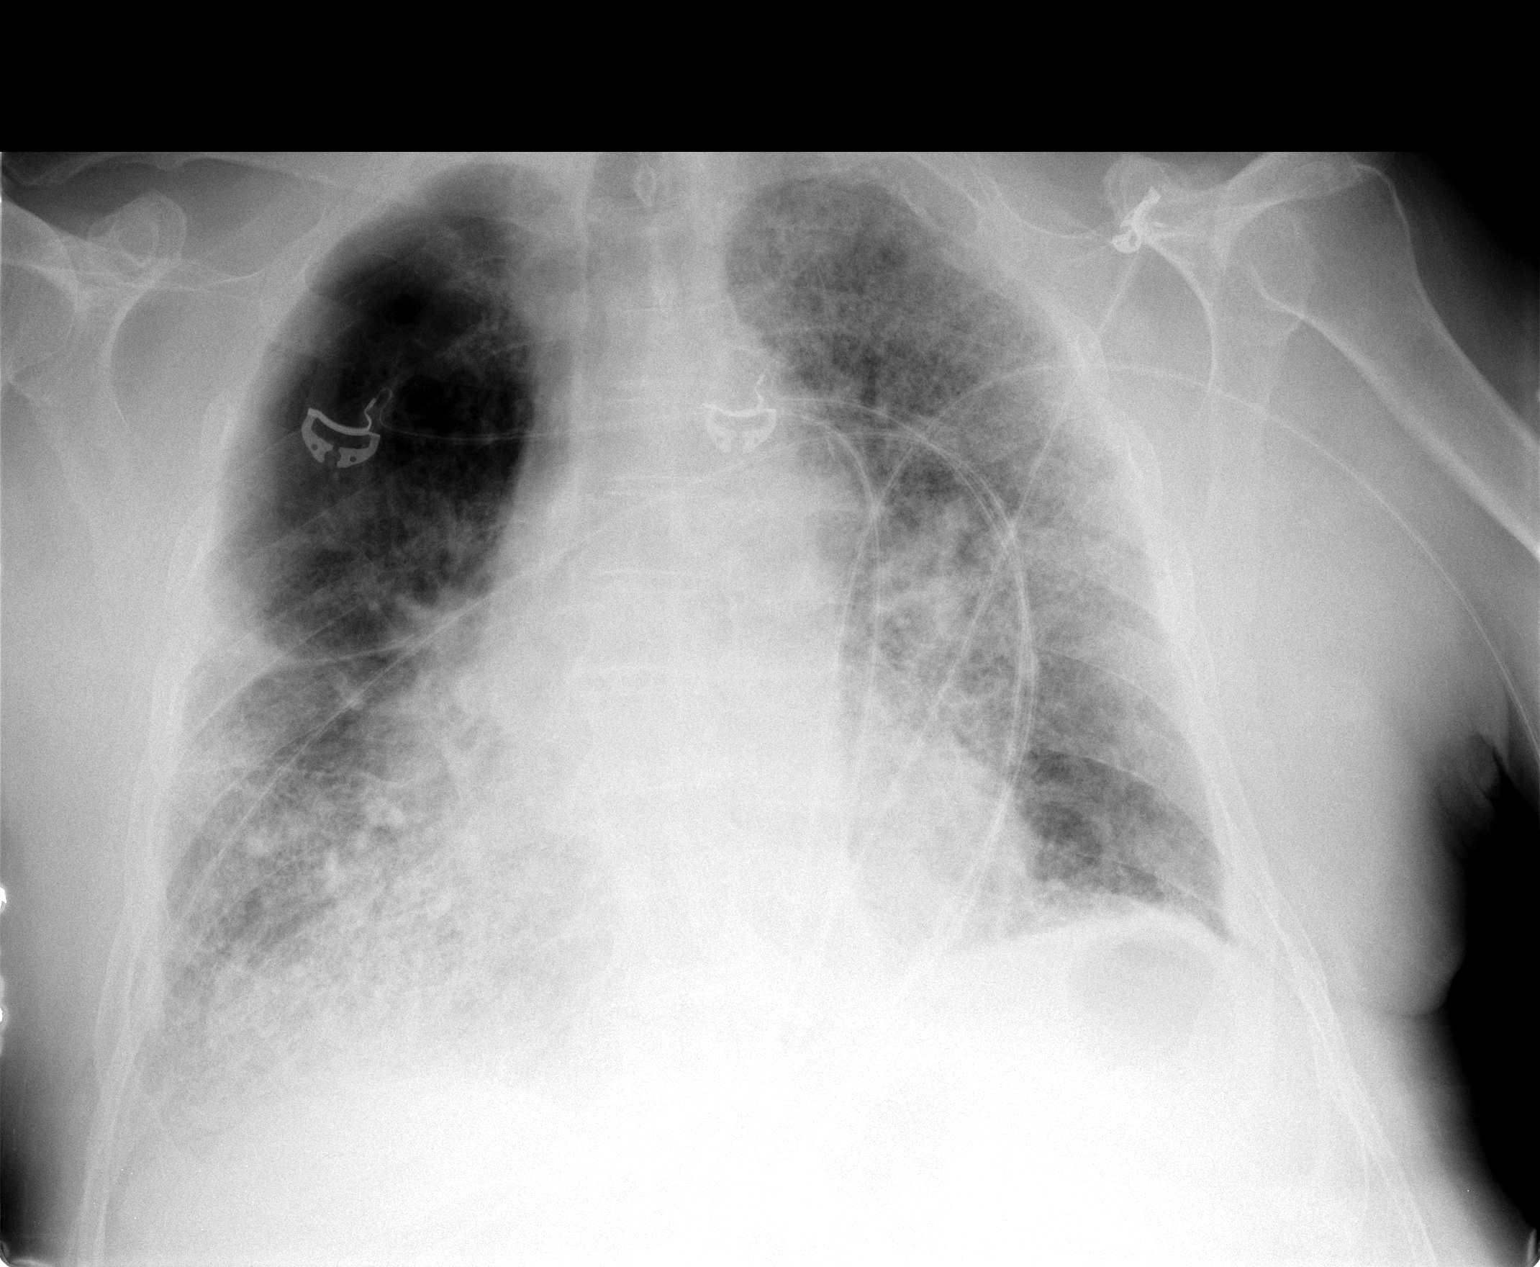

[2 of 2 positions shown; findings below may reference images not displayed]

FINDINGS: Mediastinum stable. Heart size stable. Diffuse severe left lung
pulmonary infiltrate again noted. Progressive severe right lower
lobe infiltrate present. These findings are consistent with severe
bilateral pneumonia. Pulmonary edema cannot be excluded. Small
bilateral pleural effusions cannot be excluded.
IMPRESSION: 1. Persistent diffuse severe left lung infiltrate. Progressive
severe right lower lobe infiltrate. Findings are most consistent
bilateral pneumonia. Pulmonary edema cannot be excluded.
2. Small pleural effusions.
3. Stable cardiomegaly .

## 2016-01-08 IMAGING — DX DG CHEST 1V PORT
1 series · 2 of 2 positions shown · non-contrast
Comparison: 11/01/2014

CLINICAL DATA: Pneumonia

EXAM:
PORTABLE CHEST - 1 VIEW

[Series 1: chest ap · 0.14mm/px · 2 of 2 slices shown]
[im 1/2]
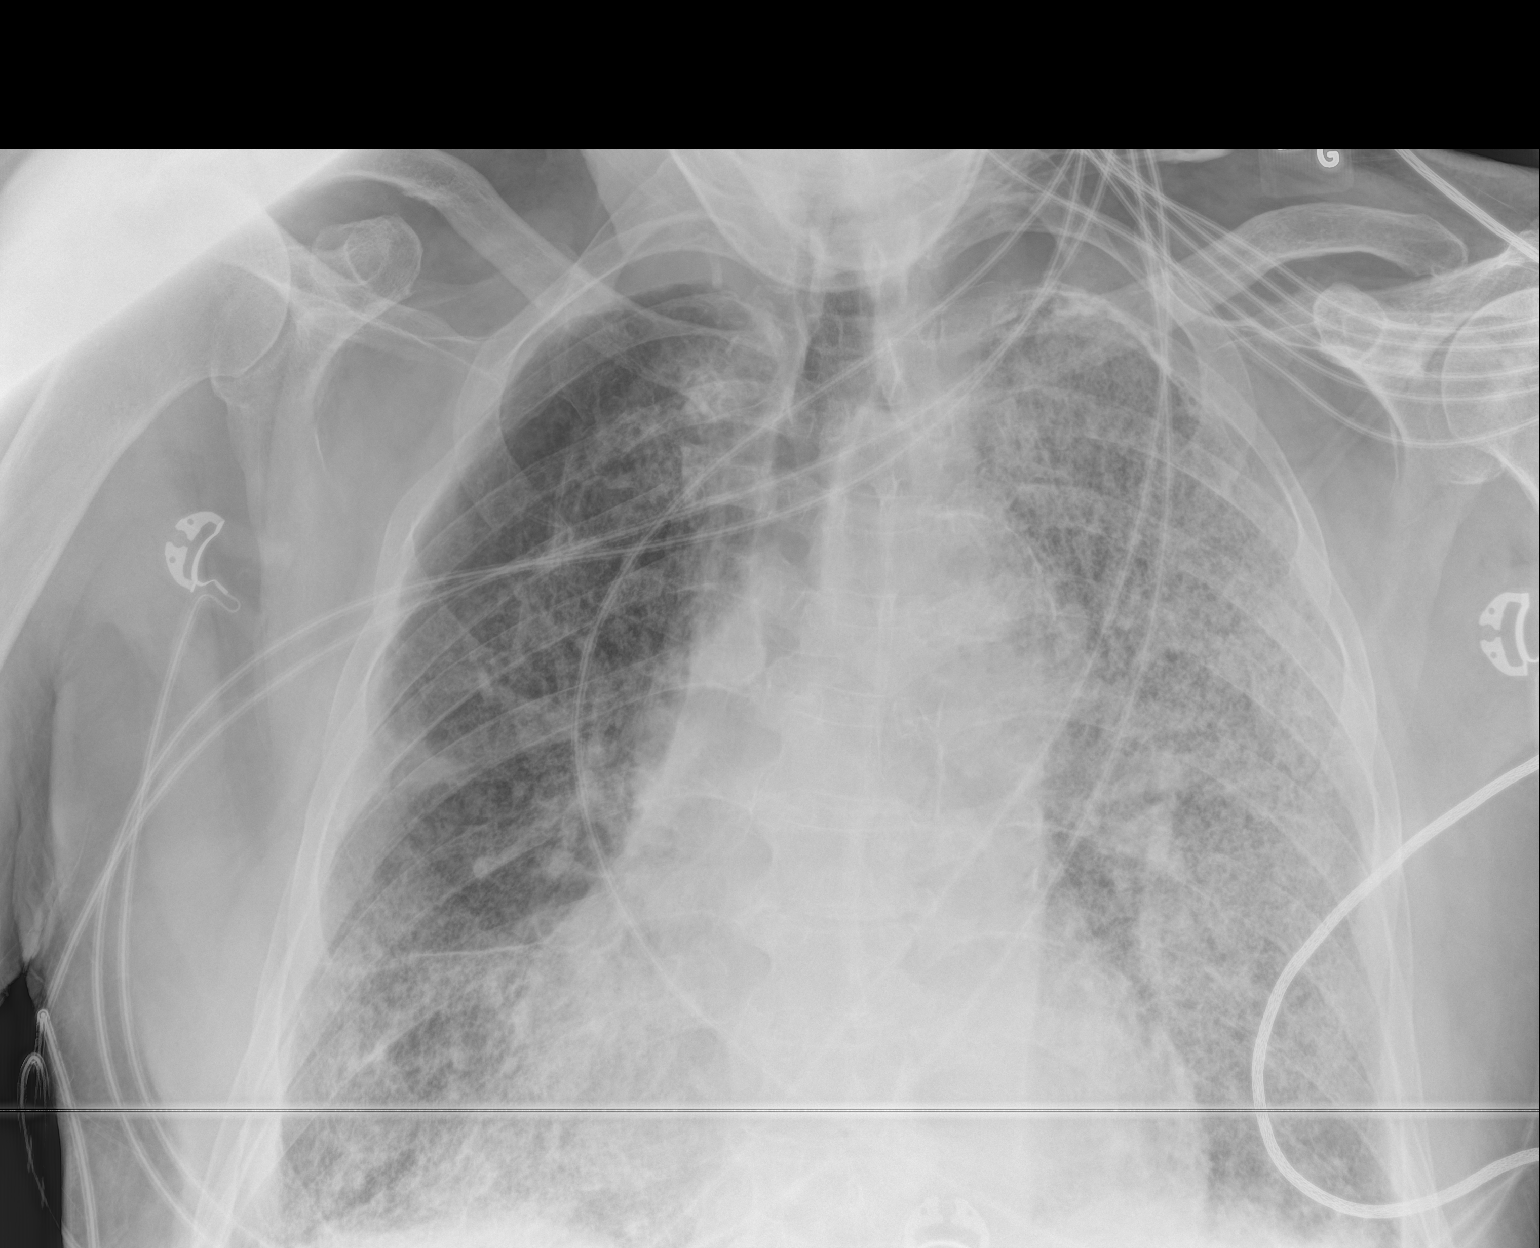
[im 2/2]
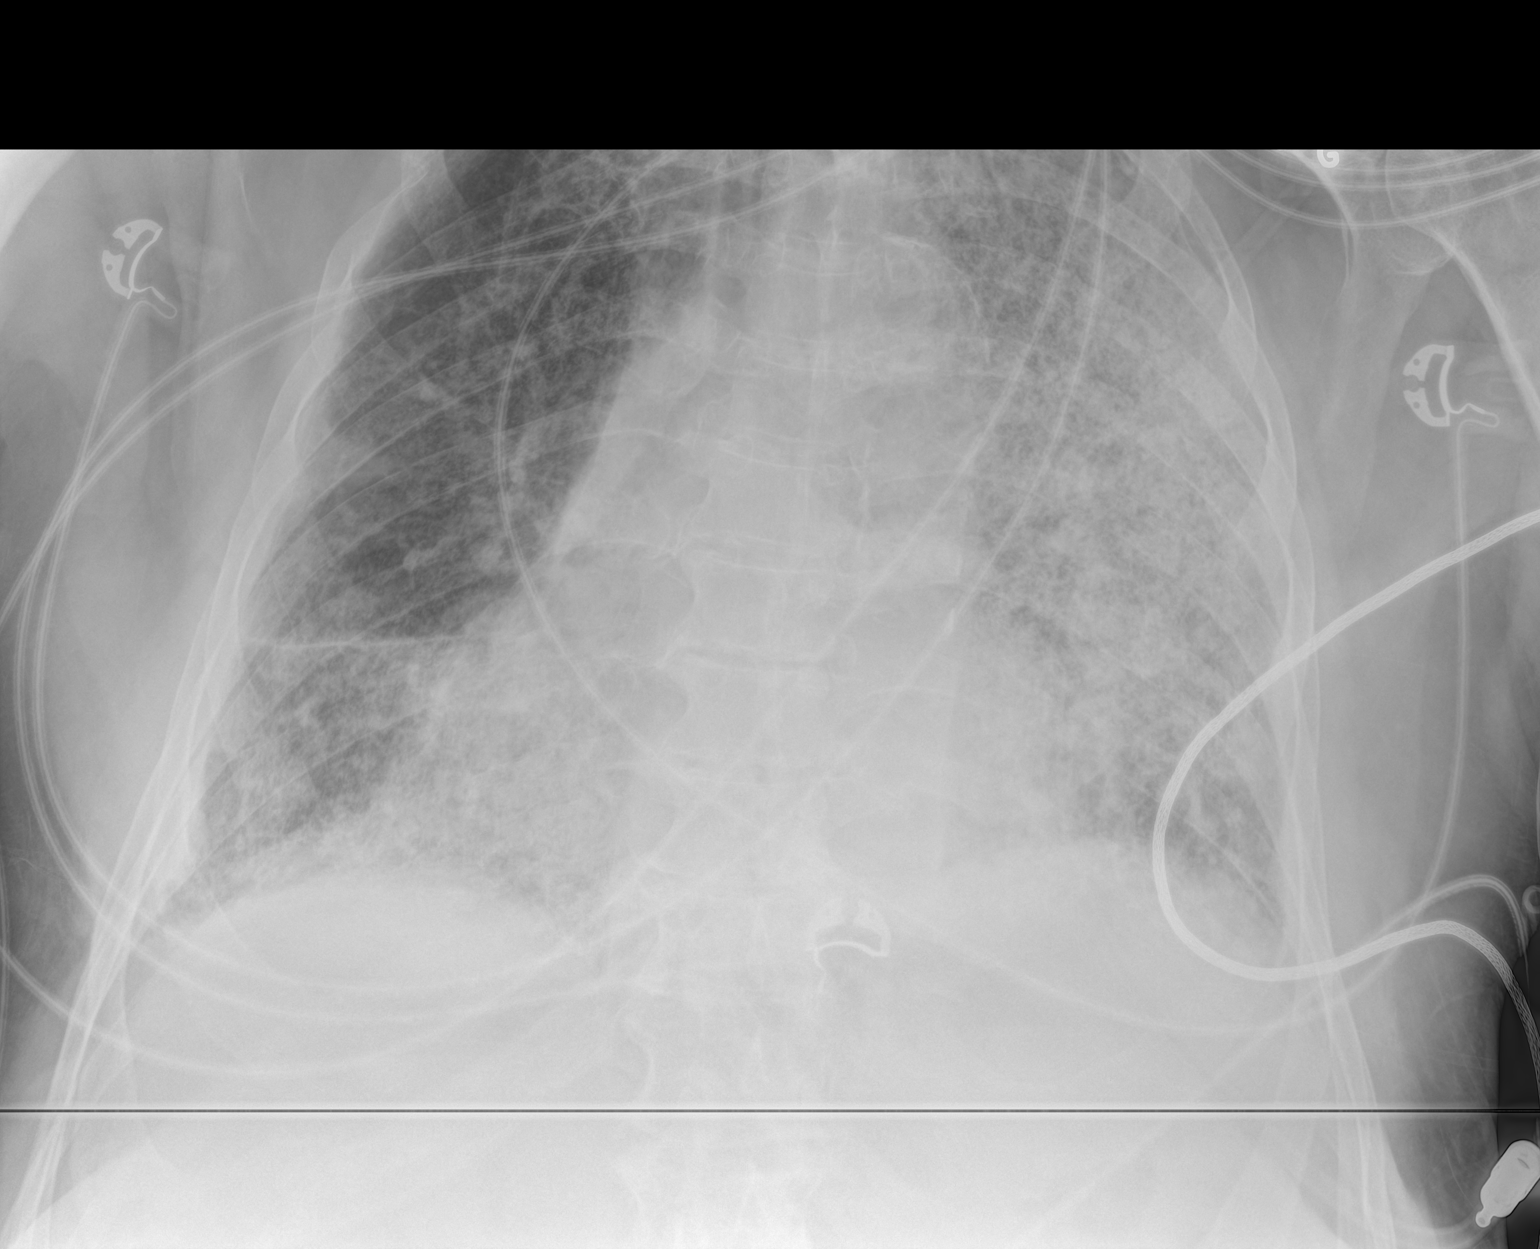

[2 of 2 positions shown; findings below may reference images not displayed]

FINDINGS: There is hyperinflation and cardiomegaly. Chronic calcific changes
are present in the apices, left greater than right. Superimposed
diffuse airspace opacities persist, probably with mild worsening in
the left central lung. No large effusions are evident.
IMPRESSION: Persistent diffuse airspace opacities, probably mildly worsened.
